# Patient Record
Sex: Female | Born: 1955 | Race: White | Hispanic: No | Marital: Married | Smoking: Former smoker
Health system: Southern US, Community
[De-identification: ages and names within clinical notes are randomized; demographics above are authoritative.]

## PROBLEM LIST (undated history)

## (undated) DIAGNOSIS — G43909 Migraine, unspecified, not intractable, without status migrainosus: Secondary | ICD-10-CM

## (undated) DIAGNOSIS — T783XXA Angioneurotic edema, initial encounter: Secondary | ICD-10-CM

## (undated) DIAGNOSIS — M1990 Unspecified osteoarthritis, unspecified site: Secondary | ICD-10-CM

## (undated) DIAGNOSIS — E785 Hyperlipidemia, unspecified: Secondary | ICD-10-CM

## (undated) DIAGNOSIS — Z8632 Personal history of gestational diabetes: Secondary | ICD-10-CM

## (undated) HISTORY — PX: IR ERCP: IMG2371

## (undated) HISTORY — PX: TOTAL HIP ARTHROPLASTY: SHX124

## (undated) HISTORY — DX: Angioneurotic edema, initial encounter: T78.3XXA

---

## 1998-02-14 ENCOUNTER — Emergency Department (HOSPITAL_COMMUNITY): Admission: EM | Admit: 1998-02-14 | Discharge: 1998-02-14 | Payer: Self-pay | Admitting: Emergency Medicine

## 1999-08-31 ENCOUNTER — Encounter: Payer: Self-pay | Admitting: Internal Medicine

## 1999-08-31 ENCOUNTER — Observation Stay (HOSPITAL_COMMUNITY): Admission: EM | Admit: 1999-08-31 | Discharge: 1999-09-02 | Payer: Self-pay | Admitting: *Deleted

## 1999-11-07 ENCOUNTER — Observation Stay (HOSPITAL_COMMUNITY): Admission: RE | Admit: 1999-11-07 | Discharge: 1999-11-07 | Payer: Self-pay | Admitting: *Deleted

## 1999-11-07 ENCOUNTER — Encounter: Payer: Self-pay | Admitting: *Deleted

## 1999-11-08 ENCOUNTER — Emergency Department (HOSPITAL_COMMUNITY): Admission: EM | Admit: 1999-11-08 | Discharge: 1999-11-08 | Payer: Self-pay | Admitting: Emergency Medicine

## 2003-11-03 ENCOUNTER — Encounter: Admission: RE | Admit: 2003-11-03 | Discharge: 2003-11-03 | Payer: Self-pay | Admitting: Emergency Medicine

## 2008-04-18 ENCOUNTER — Encounter: Admission: RE | Admit: 2008-04-18 | Discharge: 2008-04-18 | Payer: Self-pay | Admitting: Emergency Medicine

## 2010-01-30 ENCOUNTER — Inpatient Hospital Stay (HOSPITAL_COMMUNITY): Admission: RE | Admit: 2010-01-30 | Discharge: 2010-02-02 | Payer: Self-pay | Admitting: Orthopedic Surgery

## 2010-07-30 ENCOUNTER — Encounter: Payer: Self-pay | Admitting: Family Medicine

## 2010-09-22 LAB — DIFFERENTIAL
Lymphocytes Relative: 33 % (ref 12–46)
Lymphs Abs: 2.2 10*3/uL (ref 0.7–4.0)
Monocytes Relative: 7 % (ref 3–12)
Neutro Abs: 3.8 10*3/uL (ref 1.7–7.7)
Neutrophils Relative %: 58 % (ref 43–77)

## 2010-09-22 LAB — CBC
HCT: 24.7 % — ABNORMAL LOW (ref 36.0–46.0)
HCT: 30.2 % — ABNORMAL LOW (ref 36.0–46.0)
Hemoglobin: 10.5 g/dL — ABNORMAL LOW (ref 12.0–15.0)
Hemoglobin: 14.2 g/dL (ref 12.0–15.0)
MCH: 31.8 pg (ref 26.0–34.0)
MCHC: 33.9 g/dL (ref 30.0–36.0)
MCHC: 34.7 g/dL (ref 30.0–36.0)
MCV: 93.8 fL (ref 78.0–100.0)
RBC: 4.45 MIL/uL (ref 3.87–5.11)
RDW: 12.5 % (ref 11.5–15.5)

## 2010-09-22 LAB — BASIC METABOLIC PANEL
BUN: 10 mg/dL (ref 6–23)
CO2: 30 mEq/L (ref 19–32)
CO2: 30 mEq/L (ref 19–32)
Calcium: 8.5 mg/dL (ref 8.4–10.5)
Calcium: 9.5 mg/dL (ref 8.4–10.5)
Chloride: 105 mEq/L (ref 96–112)
Chloride: 105 mEq/L (ref 96–112)
GFR calc Af Amer: >60 mL/min (ref >60)
GFR calc non Af Amer: >60 mL/min (ref >60)
Glucose, Bld: 145 mg/dL — ABNORMAL HIGH (ref 70–99)
Glucose, Bld: 154 mg/dL — ABNORMAL HIGH (ref 70–99)
Potassium: 3.9 mEq/L (ref 3.5–5.1)
Sodium: 138 mEq/L (ref 135–145)
Sodium: 139 mEq/L (ref 135–145)

## 2010-09-22 LAB — PROTIME-INR: INR: 0.96 (ref 0.00–1.49)

## 2010-09-22 LAB — URINALYSIS, ROUTINE W REFLEX MICROSCOPIC
Glucose, UA: NEGATIVE mg/dL
Ketones, ur: NEGATIVE mg/dL
Nitrite: NEGATIVE
Protein, ur: NEGATIVE mg/dL

## 2010-09-22 LAB — ABO/RH: ABO/RH(D): A POS

## 2010-09-22 LAB — APTT: aPTT: 29 seconds (ref 24–37)

## 2010-09-22 LAB — SURGICAL PCR SCREEN: MRSA, PCR: NEGATIVE

## 2010-11-07 ENCOUNTER — Other Ambulatory Visit: Payer: Self-pay | Admitting: Family Medicine

## 2010-11-07 ENCOUNTER — Other Ambulatory Visit (HOSPITAL_COMMUNITY)
Admission: RE | Admit: 2010-11-07 | Discharge: 2010-11-07 | Disposition: A | Discharge status: Home / Self Care | Payer: BC Managed Care – PPO | Source: Ambulatory Visit | Attending: Family Medicine | Admitting: Family Medicine

## 2010-11-07 DIAGNOSIS — Z124 Encounter for screening for malignant neoplasm of cervix: Secondary | ICD-10-CM | POA: Insufficient documentation

## 2010-11-07 DIAGNOSIS — Z1159 Encounter for screening for other viral diseases: Secondary | ICD-10-CM | POA: Insufficient documentation

## 2010-11-23 NOTE — Procedures (Signed)
[place]  Patient:    Angel Edwards, Angel Edwards                       MRN: [id number] Proc. Date: 11/07/99 Adm. Date:  [id number] Disc. Date: [id number] Attending:  Louie Bun CC:         Vikki Ports, M.D.                           Procedure Report  PROCEDURE:  Endoscopic retrograde cholangiopancreatogram.  SURGEON:  Roosvelt Harps, M.D.  INDICATIONS:  [age]-year-old female with recent waxing and waning pancreatitis of unclear etiology.  Ultrasonogram did not reveal any gallstones.  She has a family history of pancreatitis and does not drink alcohol.  PREPARATION:  She is n.p.o. since midnight.  PREPROCEDURE SEDATION PRIOR TO AND DURING THE PROCEDURE:  She received a total f 200 mcg of Fentanyl and 20 mg of Versed intravenously; in addition, she was on L of nasal cannula O2 and received 1 mg of Glucagon to facilitate small bowel relaxation.  DESCRIPTION OF PROCEDURE:  The Olympus video duodenoscope was inserted via the mouth and advanced through the upper esophageal sphincter with ease. Intubation was then carried out into the descending duodenum.  The scope was withdrawn to he point of visualizing the ampulla.  The Tritome catheter preloaded with a guidewire was used to cannulate the ampulla easily.  Initially, the pancreatic duct was cannulated and a single injection was made to see if there were anatomical abnormalities of this duct, which appeared to be normal.  Then attention was given to cannulating the common bile duct.  This was accomplished with some difficulty because of what appeared to be acutely angulated distal common bile duct, which  initially gave the impression of possible distal stone.  The guidewire could not be advanced due to this angulation.  The Tritome catheter was withdrawn and a tapered catheter was used with a smaller guidewire but similarly could not cannulate the proximal bile duct.  The  Tritome was again inserted and another injection of the common bile duct was made.  Good films were obtained and reviewed with Dr. Lorin Picket of radiology who felt that there were no filling defects but this acute angulation was an anatomic variant that should not have any pathologic implications.  There may have been a small submucosal injection of contrast at the ampulla.  Patient tolerated the procedure well.  Pulse, blood pressure and oximetry testing were stable throughout.  She was observed in recovery for over two hours before discharge.  She will be seen in the office in followup in two to three weeks.  The x-ray films were reviewed with the radiologist.  The findings were as follows:  #1 - Possibly minimal beading of the pancreatic duct; #2 - angulated distal common bile duct ut no filling defects; #3 - possible small submucosal ampullary injection which was resorbed by the final film.  IMPRESSION:  Thus, the patient does not have pancreas disease or any anatomic defect predisposing her to pancreatitis; in addition, if she had a common bile uct stone, it seems to have passed.  She will be observed for further recurrences before any further evaluation. DD:  11/07/99 TD:  11/09/99 Job: [id number]

## 2011-01-09 IMAGING — CR DG CHEST 2V
2 series · 2 of 2 positions shown · non-contrast
Comparison: None.

CLINICAL DATA: Preop for left hip surgery

CHEST - 2 VIEW

[w chest pa]
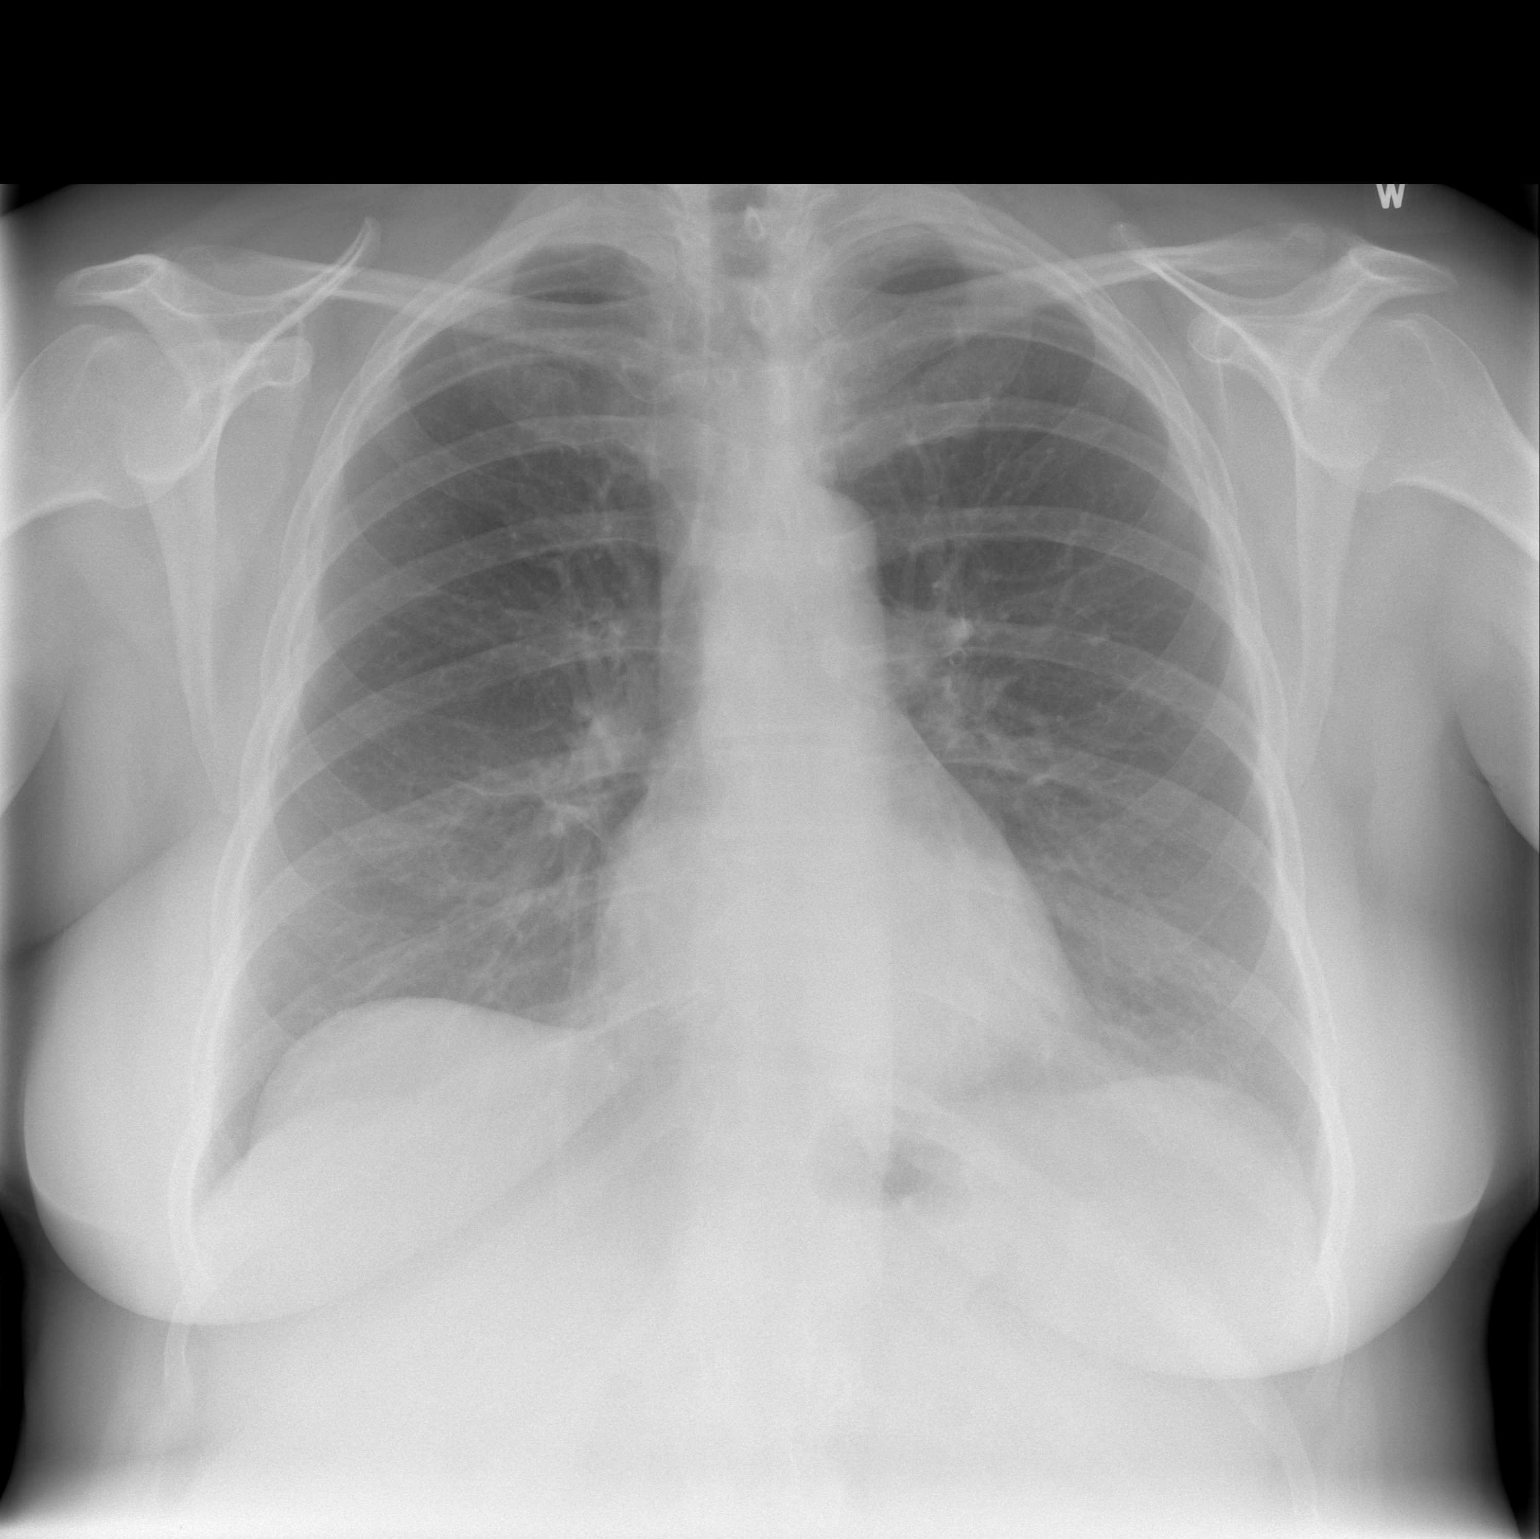

[w chest lat]
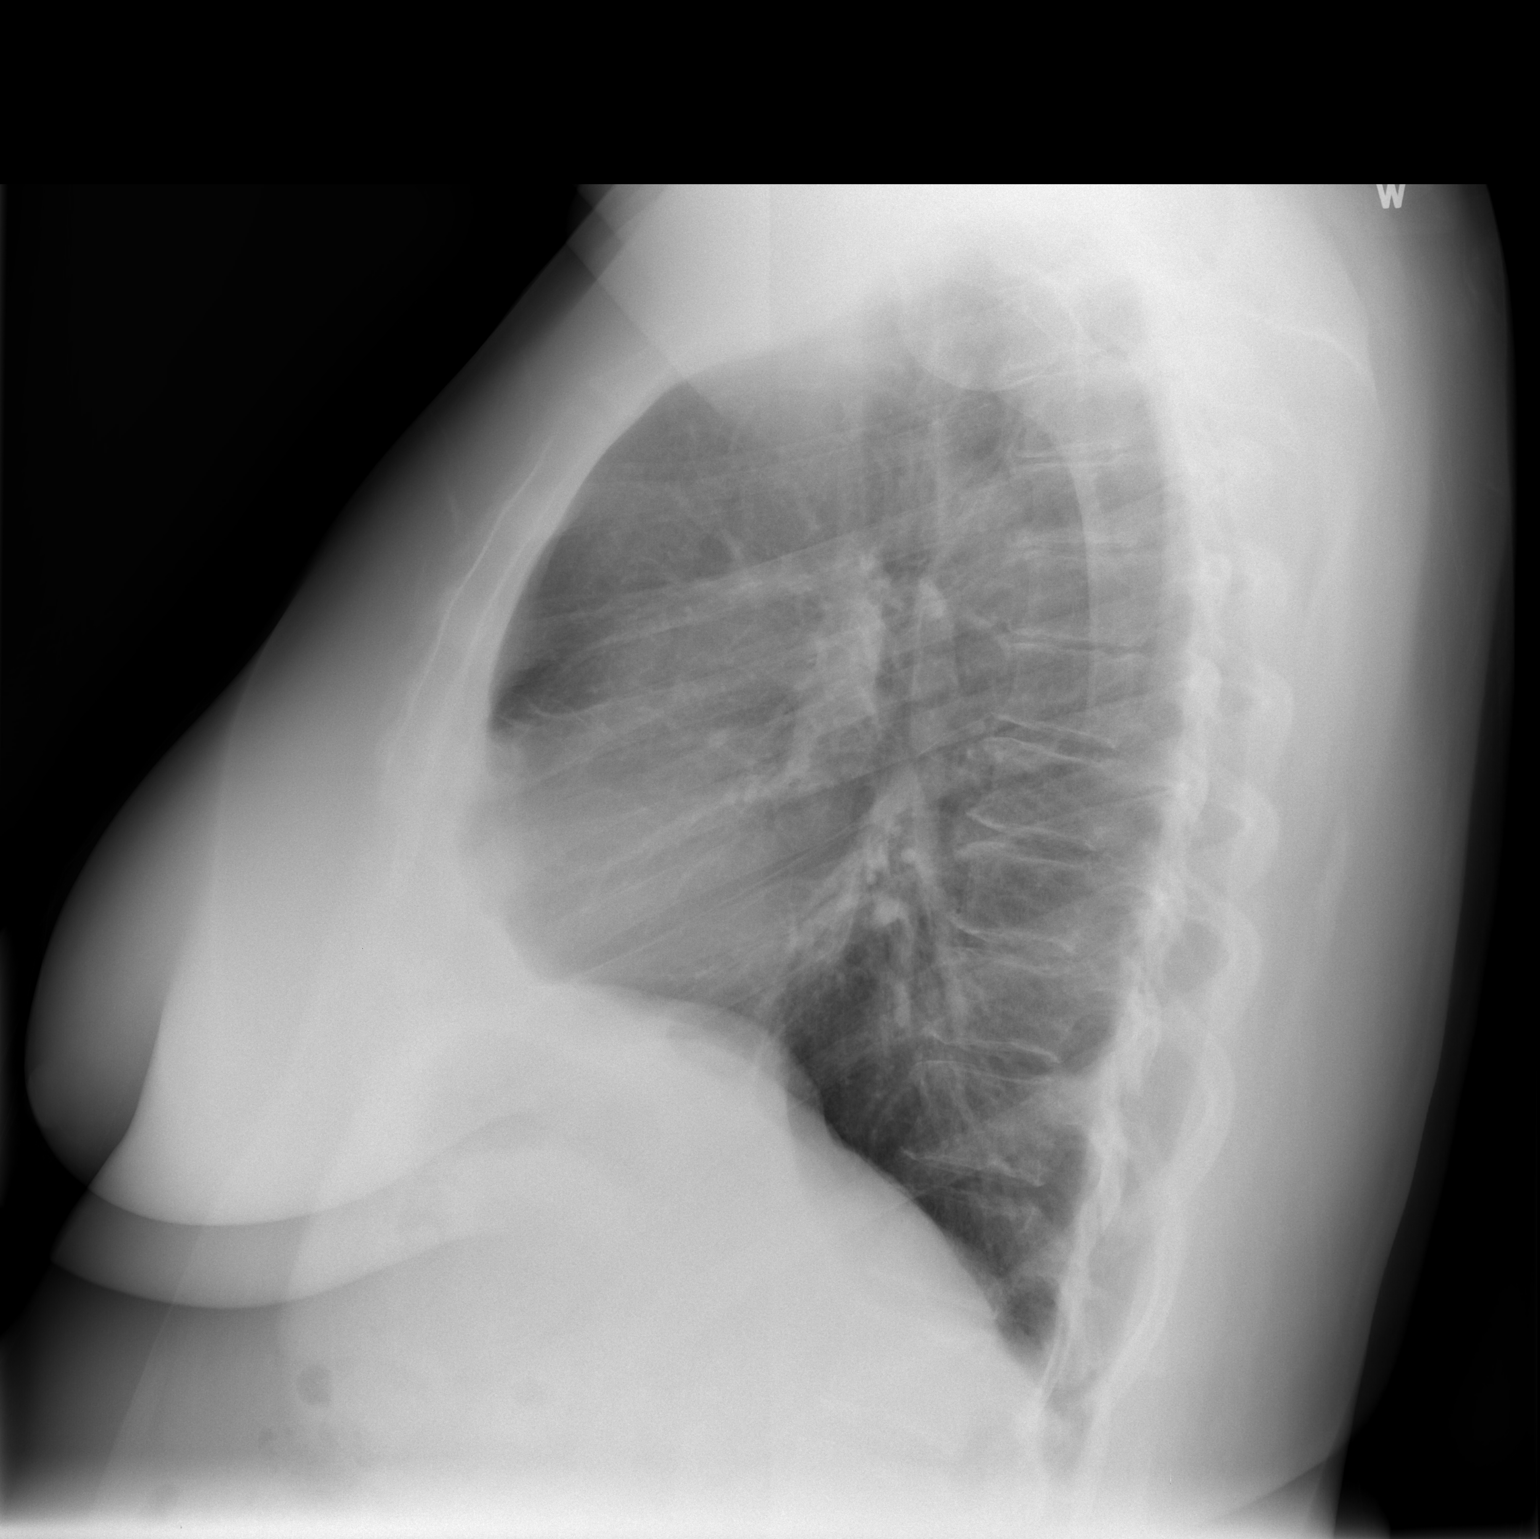

[2 of 2 positions shown; findings below may reference images not displayed]

FINDINGS: Heart and mediastinal contours normal.  Lungs clear.  No
pleural fluid.  Osseous structures and soft tissues unremarkable.
IMPRESSION: No acute or significant findings.

## 2011-10-31 ENCOUNTER — Other Ambulatory Visit: Payer: Self-pay | Admitting: Family Medicine

## 2011-10-31 DIAGNOSIS — Z1231 Encounter for screening mammogram for malignant neoplasm of breast: Secondary | ICD-10-CM

## 2011-11-27 ENCOUNTER — Ambulatory Visit
Admission: RE | Admit: 2011-11-27 | Discharge: 2011-11-27 | Disposition: A | Discharge status: Home / Self Care | Payer: BC Managed Care – PPO | Source: Ambulatory Visit | Attending: Family Medicine | Admitting: Family Medicine

## 2011-11-27 DIAGNOSIS — Z1231 Encounter for screening mammogram for malignant neoplasm of breast: Secondary | ICD-10-CM

## 2013-08-09 ENCOUNTER — Other Ambulatory Visit: Payer: Self-pay | Admitting: Gastroenterology

## 2014-03-24 ENCOUNTER — Other Ambulatory Visit: Payer: Self-pay | Admitting: Gastroenterology

## 2014-03-24 DIAGNOSIS — R1084 Generalized abdominal pain: Secondary | ICD-10-CM

## 2014-03-29 ENCOUNTER — Ambulatory Visit
Admission: RE | Admit: 2014-03-29 | Discharge: 2014-03-29 | Disposition: A | Discharge status: Home / Self Care | Payer: BC Managed Care – PPO | Source: Ambulatory Visit | Attending: Gastroenterology | Admitting: Gastroenterology

## 2014-03-29 DIAGNOSIS — R1084 Generalized abdominal pain: Secondary | ICD-10-CM

## 2015-03-16 IMAGING — US US ABDOMEN COMPLETE
1 series · 14 of 25 positions shown · non-contrast
Comparison: None.

CLINICAL DATA: Abdominal pain.  History of pancreatitis.

EXAM:
ULTRASOUND ABDOMEN COMPLETE

[Series 1: us abdomen complete · 0.28mm/px · 14 of 77 slices shown]
[im 1/77]
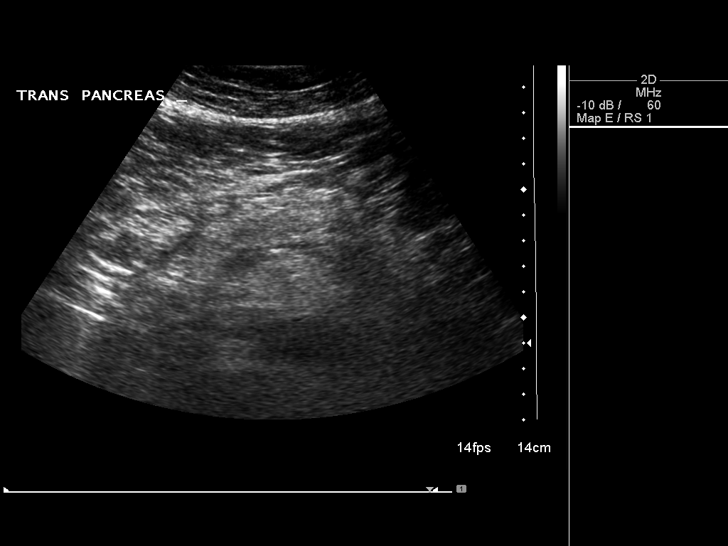
[im 7/77]
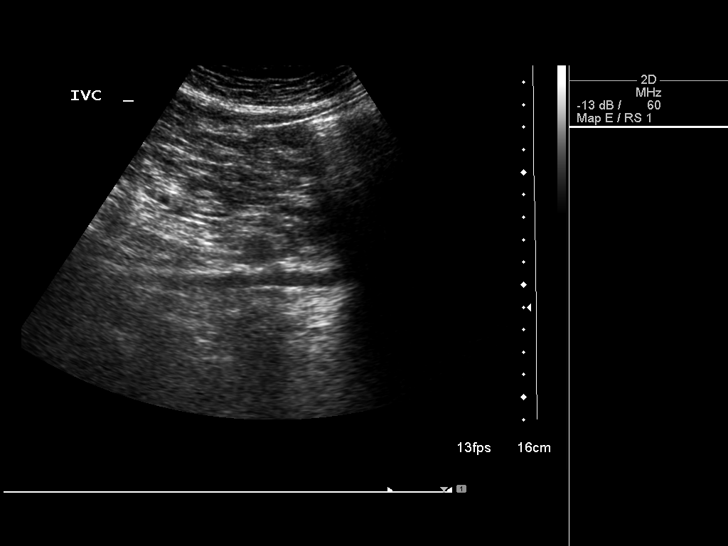
[im 13/77]
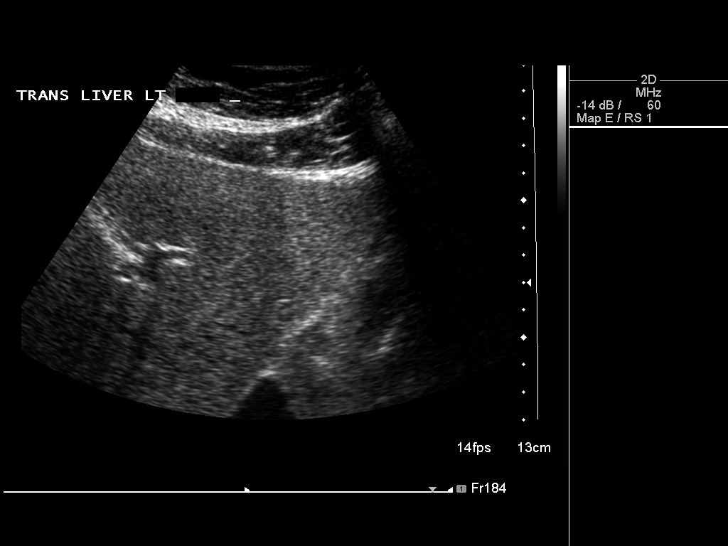
[im 20/77]
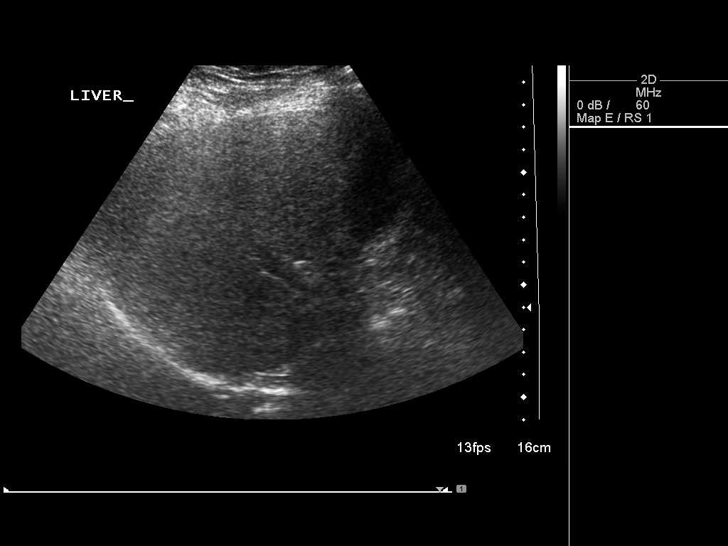
[im 26/77]
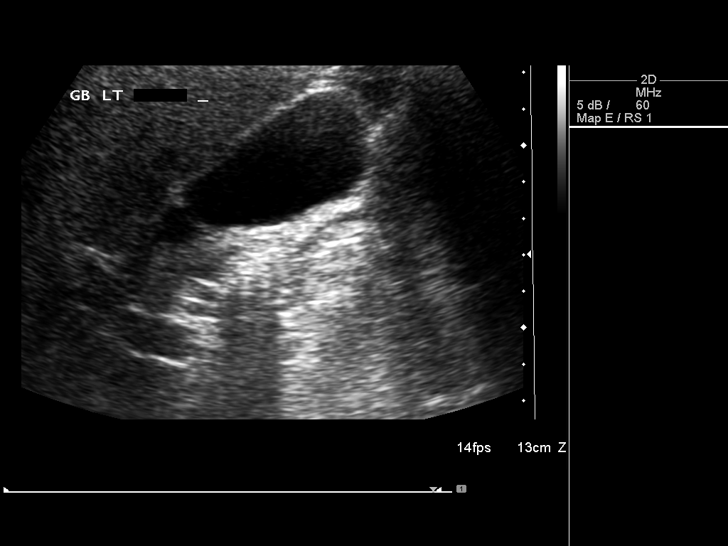
[im 29/77]
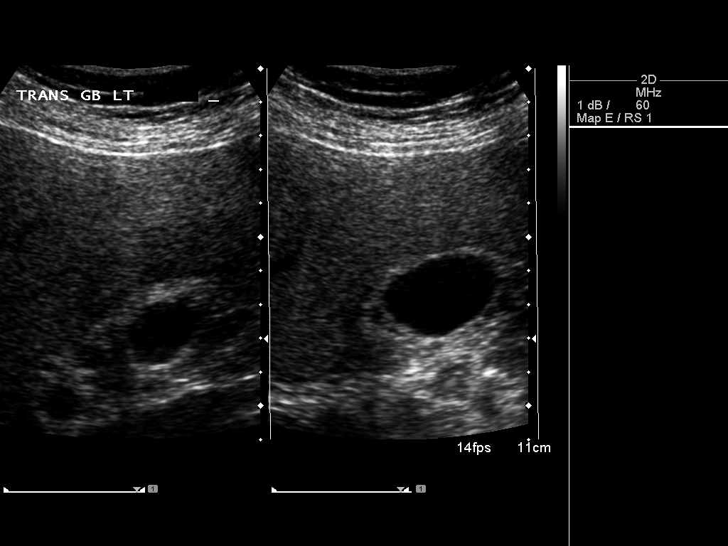
[im 35/77]
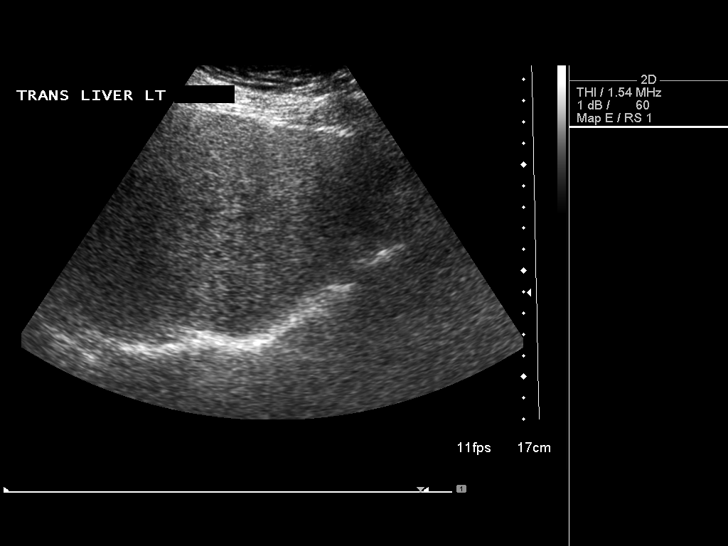
[im 42/77]
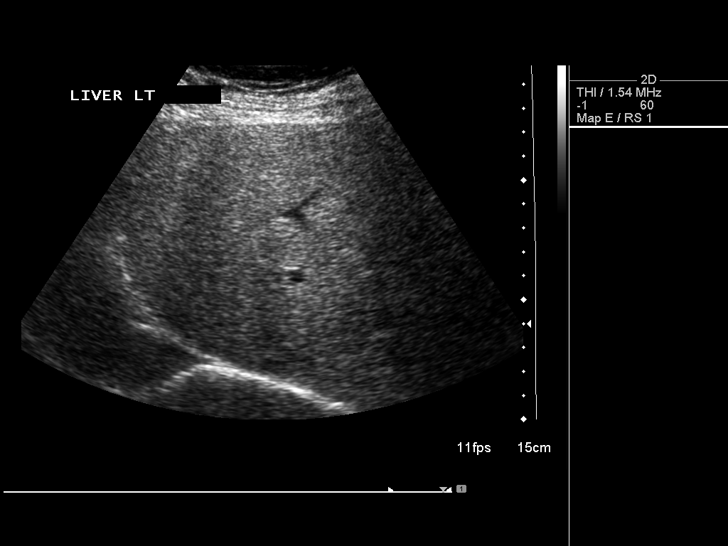
[im 48/77]
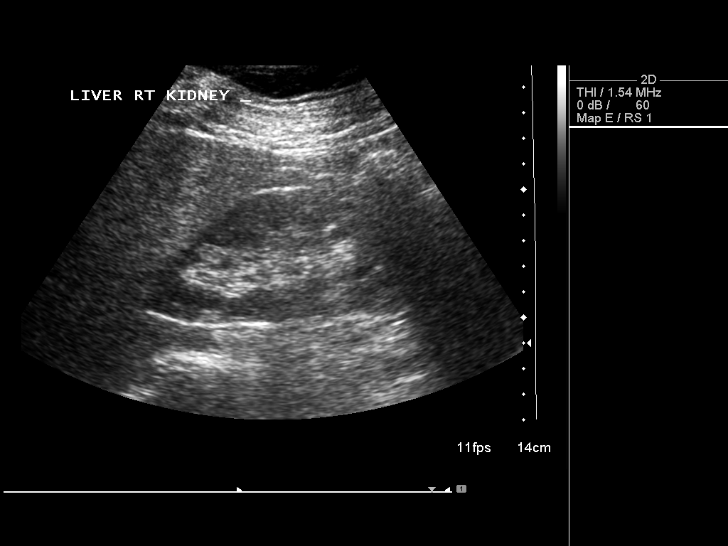
[im 51/77]
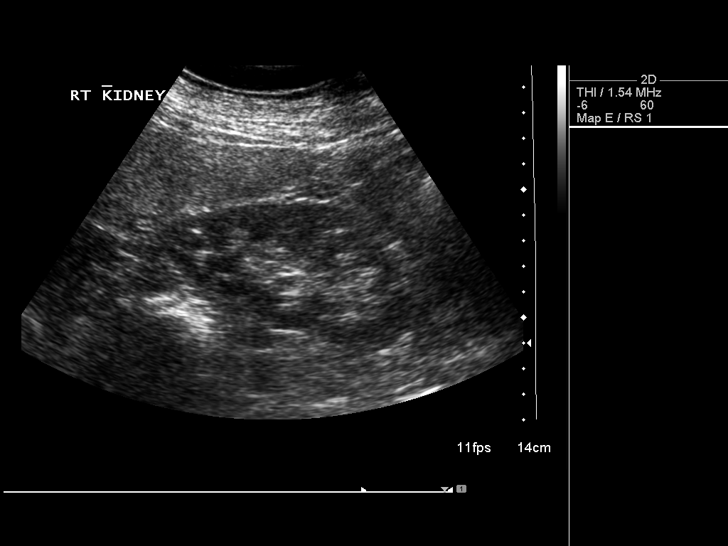
[im 58/77]
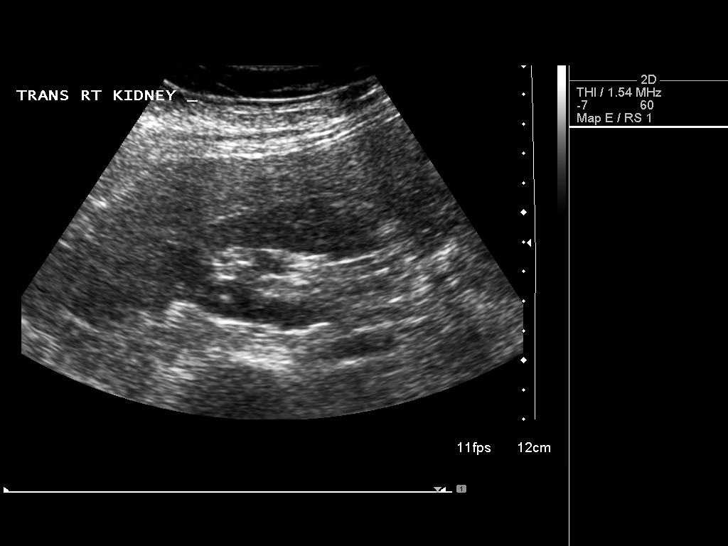
[im 64/77]
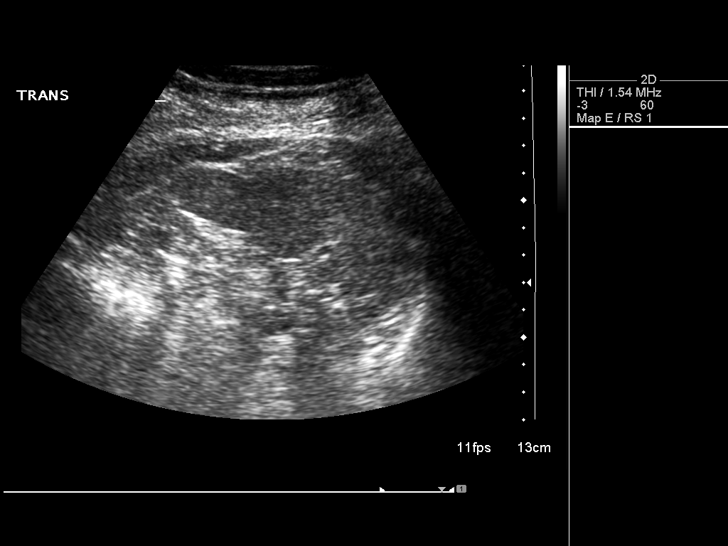
[im 70/77]
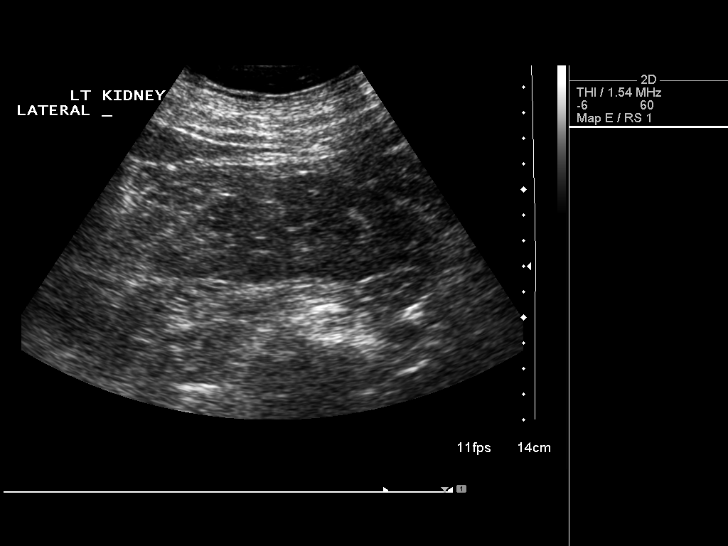
[im 77/77]
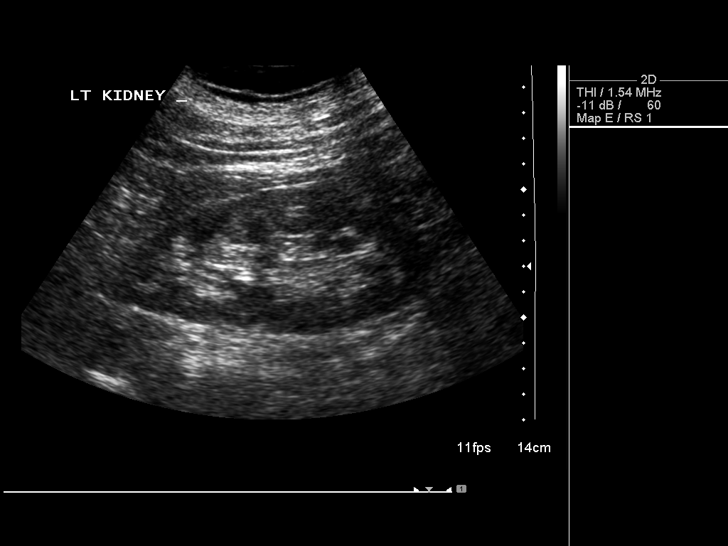

[14 of 25 positions shown; findings below may reference images not displayed]

FINDINGS: Gallbladder:

No gallstones or wall thickening visualized. No sonographic Murphy
sign noted.

Common bile duct:

Diameter: 3.1 mm, normal.

Liver:

No focal lesion identified. Within normal limits in parenchymal
echogenicity.

IVC:

No abnormality visualized.

Pancreas:

Visualized portion unremarkable.

Spleen:

7.1 cm, normal.

Right Kidney:

Length: 11.1 cm. Echogenicity within normal limits. No mass or
hydronephrosis visualized.

Left Kidney:

Length: 0.5 cm. Echogenicity within normal limits. No mass or
hydronephrosis visualized.

Abdominal aorta:

2.3 cm, normal.

Other findings:

None.
IMPRESSION: Normal exam.

## 2017-08-29 DIAGNOSIS — M1611 Unilateral primary osteoarthritis, right hip: Secondary | ICD-10-CM | POA: Diagnosis not present

## 2017-08-29 DIAGNOSIS — M25551 Pain in right hip: Secondary | ICD-10-CM | POA: Diagnosis not present

## 2017-10-08 DIAGNOSIS — M1611 Unilateral primary osteoarthritis, right hip: Secondary | ICD-10-CM | POA: Diagnosis not present

## 2017-11-12 DIAGNOSIS — E782 Mixed hyperlipidemia: Secondary | ICD-10-CM | POA: Diagnosis not present

## 2017-11-12 DIAGNOSIS — E559 Vitamin D deficiency, unspecified: Secondary | ICD-10-CM | POA: Diagnosis not present

## 2017-11-12 DIAGNOSIS — E538 Deficiency of other specified B group vitamins: Secondary | ICD-10-CM | POA: Diagnosis not present

## 2017-11-12 DIAGNOSIS — R7303 Prediabetes: Secondary | ICD-10-CM | POA: Diagnosis not present

## 2017-11-12 DIAGNOSIS — Z01810 Encounter for preprocedural cardiovascular examination: Secondary | ICD-10-CM | POA: Diagnosis not present

## 2017-11-20 DIAGNOSIS — Z01810 Encounter for preprocedural cardiovascular examination: Secondary | ICD-10-CM | POA: Diagnosis not present

## 2017-11-20 DIAGNOSIS — E785 Hyperlipidemia, unspecified: Secondary | ICD-10-CM | POA: Diagnosis not present

## 2018-01-02 NOTE — Patient Instructions (Addendum)
Angel Edwards  01/02/2018   Your procedure is scheduled on:  01-14-2018  Report to [facility] Main  Entrance  Report to admitting at  6:00 AM    Call this number if you have problems the morning of surgery [phone]   Remember: Do not eat food or drink liquids :After Midnight.     Take these medicines the morning of surgery with A SIP OF WATER:   NONE                                You may not have any metal on your body including hair pins and              piercings  Do not wear jewelry, make-up, lotions, powders or perfumes, deodorant             Do not wear nail polish.  Do not shave  48 hours prior to surgery.    Do not bring valuables to the hospital. Center IS NOT             RESPONSIBLE   FOR VALUABLES.  Contacts, dentures or bridgework may not be worn into surgery.  Leave suitcase in the car. After surgery it may be brought to your room.     Special Instructions:  COUGH/ DEEP BREATHING AND LEG EXERCISES              Please read over the following fact sheets you were given: _____________________________________________________________________             [facility] - Preparing for Surgery Before surgery, you can play an important role.  Because skin is not sterile, your skin needs to be as free of germs as possible.  You can reduce the number of germs on your skin by washing with CHG (chlorahexidine gluconate) soap before surgery.  CHG is an antiseptic cleaner which kills germs and bonds with the skin to continue killing germs even after washing. Please DO NOT use if you have an allergy to CHG or antibacterial soaps.  If your skin becomes reddened/irritated stop using the CHG and inform your nurse when you arrive at Short Stay. Do not shave (including legs and underarms) for at least 48 hours prior to the first CHG shower.  You may shave your face/neck. Please follow these instructions carefully:  1.  Shower with CHG Soap the night  before surgery and the  morning of Surgery.  2.  If you choose to wash your hair, wash your hair first as usual with your  normal  shampoo.  3.  After you shampoo, rinse your hair and body thoroughly to remove the  shampoo.                                        4.  Use CHG as you would any other liquid soap.  You can apply chg directly  to the skin and wash                       Gently with a scrungie or clean washcloth.  5.  Apply the CHG Soap to your body ONLY FROM THE NECK DOWN.   Do not use on face/ open  Wound or open sores. Avoid contact with eyes, ears mouth and genitals (private parts).                       Wash face,  Genitals (private parts) with your normal soap.             6.  Wash thoroughly, paying special attention to the area where your surgery  will be performed.  7.  Thoroughly rinse your body with warm water from the neck down.  8.  DO NOT shower/wash with your normal soap after using and rinsing off  the CHG Soap.             9.  Pat yourself dry with a clean towel.            10.  Wear clean pajamas.            11.  Place clean sheets on your bed the night of your first shower and do not  sleep with pets. Day of Surgery : Do not apply any lotions/deodorants the morning of surgery.  Please wear clean clothes to the hospital/surgery center.  FAILURE TO FOLLOW THESE INSTRUCTIONS MAY RESULT IN THE CANCELLATION OF YOUR SURGERY PATIENT SIGNATURE_________________________________  NURSE SIGNATURE__________________________________  ________________________________________________________________________   Angel Edwards  An incentive spirometer is a tool that can help keep your lungs clear and active. This tool measures how well you are filling your lungs with each breath. Taking long deep breaths may help reverse or decrease the chance of developing breathing (pulmonary) problems (especially infection) following:  A long period of time when  you are unable to move or be active. BEFORE THE PROCEDURE   If the spirometer includes an indicator to show your best effort, your nurse or respiratory therapist will set it to a desired goal.  If possible, sit up straight or lean slightly forward. Try not to slouch.  Hold the incentive spirometer in an upright position. INSTRUCTIONS FOR USE  1. Sit on the edge of your bed if possible, or sit up as far as you can in bed or on a chair. 2. Hold the incentive spirometer in an upright position. 3. Breathe out normally. 4. Place the mouthpiece in your mouth and seal your lips tightly around it. 5. Breathe in slowly and as deeply as possible, raising the piston or the ball toward the top of the column. 6. Hold your breath for 3-5 seconds or for as long as possible. Allow the piston or ball to fall to the bottom of the column. 7. Remove the mouthpiece from your mouth and breathe out normally. 8. Rest for a few seconds and repeat Steps 1 through 7 at least 10 times every 1-2 hours when you are awake. Take your time and take a few normal breaths between deep breaths. 9. The spirometer may include an indicator to show your best effort. Use the indicator as a goal to work toward during each repetition. 10. After each set of 10 deep breaths, practice coughing to be sure your lungs are clear. If you have an incision (the cut made at the time of surgery), support your incision when coughing by placing a pillow or rolled up towels firmly against it. Once you are able to get out of bed, walk around indoors and cough well. You may stop using the incentive spirometer when instructed by your caregiver.  RISKS AND COMPLICATIONS  Take your time so you do not get dizzy or light-headed.  If you are in pain, you may need to take or ask for pain medication before doing incentive spirometry. It is harder to take a deep breath if you are having pain. AFTER USE  Rest and breathe slowly and easily.  It can be  helpful to keep track of a log of your progress. Your caregiver can provide you with a simple table to help with this. If you are using the spirometer at home, follow these instructions: SEEK MEDICAL CARE IF:   You are having difficultly using the spirometer.  You have trouble using the spirometer as often as instructed.  Your pain medication is not giving enough relief while using the spirometer.  You develop fever of 100.5 F (38.1 C) or higher. SEEK IMMEDIATE MEDICAL CARE IF:   You cough up bloody sputum that had not been present before.  You develop fever of 102 F (38.9 C) or greater.  You develop worsening pain at or near the incision site. MAKE SURE YOU:   Understand these instructions.  Will watch your condition.  Will get help right away if you are not doing well or get worse. Document Released: 11/04/2006 Document Revised: 09/16/2011 Document Reviewed: 01/05/2007 Exit[employer] Patient Information 2014 [employer], [place].   ________________________________________________________________________  WHAT IS A BLOOD TRANSFUSION? Blood Transfusion Information  A transfusion is the replacement of blood or some of its parts. Blood is made up of multiple cells which provide different functions.  Red blood cells carry oxygen and are used for blood loss replacement.  White blood cells fight against infection.  Platelets control bleeding.  Plasma helps clot blood.  Other blood products are available for specialized needs, such as hemophilia or other clotting disorders. BEFORE THE TRANSFUSION  Who gives blood for transfusions?   Healthy volunteers who are fully evaluated to make sure their blood is safe. This is blood bank blood. Transfusion therapy is the safest it has ever been in the practice of medicine. Before blood is taken from a donor, a complete history is taken to make sure that person has no history of diseases nor engages in risky social behavior (examples are  intravenous drug use or sexual activity with multiple partners). The donor's travel history is screened to minimize risk of transmitting infections, such as malaria. The donated blood is tested for signs of infectious diseases, such as HIV and hepatitis. The blood is then tested to be sure it is compatible with you in order to minimize the chance of a transfusion reaction. If you or a relative donates blood, this is often done in anticipation of surgery and is not appropriate for emergency situations. It takes many days to process the donated blood. RISKS AND COMPLICATIONS Although transfusion therapy is very safe and saves many lives, the main dangers of transfusion include:   Getting an infectious disease.  Developing a transfusion reaction. This is an allergic reaction to something in the blood you were given. Every precaution is taken to prevent this. The decision to have a blood transfusion has been considered carefully by your caregiver before blood is given. Blood is not given unless the benefits outweigh the risks. AFTER THE TRANSFUSION  Right after receiving a blood transfusion, you will usually feel much better and more energetic. This is especially true if your red blood cells have gotten low (anemic). The transfusion raises the level of the red blood cells which carry oxygen, and this usually causes an energy increase.  The nurse administering the transfusion will monitor you carefully for  complications. HOME CARE INSTRUCTIONS  No special instructions are needed after a transfusion. You may find your energy is better. Speak with your caregiver about any limitations on activity for underlying diseases you may have. SEEK MEDICAL CARE IF:   Your condition is not improving after your transfusion.  You develop redness or irritation at the intravenous (IV) site. SEEK IMMEDIATE MEDICAL CARE IF:  Any of the following symptoms occur over the next 12 hours:  Shaking chills.  You have a  temperature by mouth above 102 F (38.9 C), not controlled by medicine.  Chest, back, or muscle pain.  People around you feel you are not acting correctly or are confused.  Shortness of breath or difficulty breathing.  Dizziness and fainting.  You get a rash or develop hives.  You have a decrease in urine output.  Your urine turns a dark color or changes to pink, red, or brown. Any of the following symptoms occur over the next 10 days:  You have a temperature by mouth above 102 F (38.9 C), not controlled by medicine.  Shortness of breath.  Weakness after normal activity.  The white part of the eye turns yellow (jaundice).  You have a decrease in the amount of urine or are urinating less often.  Your urine turns a dark color or changes to pink, red, or brown. Document Released: 06/21/2000 Document Revised: 09/16/2011 Document Reviewed: 02/08/2008 [facility] Patient Information 2014 [place], [place].  _______________________________________________________________________

## 2018-01-05 ENCOUNTER — Encounter (HOSPITAL_COMMUNITY)
Admission: RE | Admit: 2018-01-05 | Discharge: 2018-01-05 | Disposition: A | Discharge status: Home / Self Care | Payer: 59 | Source: Ambulatory Visit | Attending: Orthopedic Surgery | Admitting: Orthopedic Surgery

## 2018-01-05 ENCOUNTER — Encounter (HOSPITAL_COMMUNITY): Payer: Self-pay

## 2018-01-05 DIAGNOSIS — M1611 Unilateral primary osteoarthritis, right hip: Secondary | ICD-10-CM | POA: Insufficient documentation

## 2018-01-05 DIAGNOSIS — Z01812 Encounter for preprocedural laboratory examination: Secondary | ICD-10-CM | POA: Diagnosis not present

## 2018-01-05 HISTORY — DX: Migraine, unspecified, not intractable, without status migrainosus: G43.909

## 2018-01-05 HISTORY — DX: Personal history of gestational diabetes: Z86.32

## 2018-01-05 HISTORY — DX: Unspecified osteoarthritis, unspecified site: M19.90

## 2018-01-05 HISTORY — DX: Hyperlipidemia, unspecified: E78.5

## 2018-01-05 LAB — COMPREHENSIVE METABOLIC PANEL
ALBUMIN: 4.1 g/dL (ref 3.5–5.0)
ALK PHOS: 53 U/L (ref 38–126)
ALT: 17 U/L (ref 0–44)
ANION GAP: 8 (ref 5–15)
AST: 16 U/L (ref 15–41)
BILIRUBIN TOTAL: 1 mg/dL (ref 0.3–1.2)
BUN: 14 mg/dL (ref 8–23)
CALCIUM: 9.3 mg/dL (ref 8.9–10.3)
CO2: 27 mmol/L (ref 22–32)
Chloride: 106 mmol/L (ref 98–111)
Creatinine, Ser: 0.64 mg/dL (ref 0.44–1.00)
GFR calc Af Amer: >60 mL/min (ref >60)
GLUCOSE: 115 mg/dL — AB (ref 70–99)
Potassium: 4.2 mmol/L (ref 3.5–5.1)
Sodium: 141 mmol/L (ref 135–145)
TOTAL PROTEIN: 7.5 g/dL (ref 6.5–8.1)

## 2018-01-05 LAB — URINALYSIS, ROUTINE W REFLEX MICROSCOPIC
Bilirubin Urine: NEGATIVE
Glucose, UA: NEGATIVE mg/dL
Hgb urine dipstick: NEGATIVE
Ketones, ur: NEGATIVE mg/dL
Leukocytes, UA: NEGATIVE
NITRITE: NEGATIVE
PH: 7 (ref 5.0–8.0)
Protein, ur: NEGATIVE mg/dL
SPECIFIC GRAVITY, URINE: 1.005 (ref 1.005–1.030)

## 2018-01-05 LAB — CBC
HEMATOCRIT: 43.2 % (ref 36.0–46.0)
HEMOGLOBIN: 14.1 g/dL (ref 12.0–15.0)
MCH: 32.1 pg (ref 26.0–34.0)
MCHC: 32.6 g/dL (ref 30.0–36.0)
MCV: 98.4 fL (ref 78.0–100.0)
Platelets: 359 10*3/uL (ref 150–400)
RBC: 4.39 MIL/uL (ref 3.87–5.11)
RDW: 12.4 % (ref 11.5–15.5)
WBC: 6.4 10*3/uL (ref 4.0–10.5)

## 2018-01-05 LAB — SURGICAL PCR SCREEN
MRSA, PCR: NEGATIVE
Staphylococcus aureus: NEGATIVE

## 2018-01-05 LAB — APTT: APTT: 24 s (ref 24–36)

## 2018-01-05 LAB — PROTIME-INR
INR: 0.96
PROTHROMBIN TIME: 12.6 s (ref 11.4–15.2)

## 2018-01-05 NOTE — Progress Notes (Signed)
Surgical clearance from dr Juluis Rainierelizabeth barnes w/ chart.  Cardiologist note and ekg, dated 11-20-2017, w/ chart.

## 2018-01-06 NOTE — H&P (Signed)
TOTAL HIP ADMISSION H&P  Patient is admitted for right total hip arthroplasty.  Subjective:  Chief Complaint: right hip pain  HPI: Angel Edwards, 62 y.o. female, has a history of pain and functional disability in the right hip(s) due to arthritis and patient has failed non-surgical conservative treatments for greater than 12 weeks to include NSAID's and/or analgesics, corticosteriod injections and activity modification.  Onset of symptoms was gradual starting 2 years ago with gradually worsening course since that time.The patient noted no past surgery on the right hip(s).  Patient currently rates pain in the right hip at 5 out of 10 with activity. Patient has night pain, worsening of pain with activity and weight bearing and pain that interfers with activities of daily living. Patient has evidence of subchondral cysts and joint space narrowing by imaging studies. This condition presents safety issues increasing the risk of falls. There is no current active infection.  There are no active problems to display for this patient.  Past Medical History:  Diagnosis Date  . History of gestational diabetes   . Hyperlipidemia   . Migraines   . OA (osteoarthritis)    right hip      No current facility-administered medications for this encounter.    Current Outpatient Medications  Medication Sig Dispense Refill Last Dose  . acetaminophen (TYLENOL) 650 MG CR tablet Take 1,300 mg by mouth daily as needed for pain.     Marland Kitchen. ibuprofen (ADVIL,MOTRIN) 200 MG tablet Take 400 mg by mouth daily as needed for moderate pain.     . Menthol, Topical Analgesic, (BIOFREEZE EX) Apply 1 application topically daily as needed (hip pain).     . naproxen sodium (ALEVE) 220 MG tablet Take 440 mg by mouth daily as needed (pain).      Allergies  Allergen Reactions  . Demerol [Meperidine Hcl] Nausea And Vomiting  . Lidocaine Other (See Comments)    Body absorbs medication too fast, that it wears off quickly    Social  History   Tobacco Use  . Smoking status: Former Smoker    Packs/day: 1.00    Years: 15.00    Pack years: 15.00    Types: Cigarettes    Last attempt to quit: 01/06/2007    Years since quitting: 11.0  . Smokeless tobacco: Never Used  Substance Use Topics  . Alcohol use: Never    Frequency: Never    No family history on file.   Review of Systems  Constitutional: Negative for chills and fever.  HENT: Negative for congestion, sore throat and tinnitus.   Eyes: Negative for double vision, photophobia and pain.  Respiratory: Negative for cough, shortness of breath and wheezing.   Cardiovascular: Negative for chest pain, palpitations and orthopnea.  Gastrointestinal: Negative for heartburn, nausea and vomiting.  Genitourinary: Negative for dysuria, frequency and urgency.  Musculoskeletal: Positive for joint pain.  Neurological: Negative for dizziness, weakness and headaches.  Psychiatric/Behavioral: Negative for depression.    Objective:  Physical Exam  Well nourished and well developed. General: Alert and oriented x3, cooperative and pleasant, no acute distress. Head: normocephalic, atraumatic, neck supple. Eyes: EOMI. Respiratory: breath sounds clear in all fields, no wheezing, rales, or rhonchi. Cardiovascular: Regular rate and rhythm, no murmurs, gallops or rubs.  Abdomen: non-tender to palpation and soft, normoactive bowel sounds. Musculoskeletal: LEFT hip and knee flexed to 100, rotated in 30 come out 40 to abducted 40 without discomfort. There is no trochanteric tenderness.  RIGHT hip to be flexed to 110  with minimal internal rotation about 20 external rotation and about the size 30 of abduction. She has an antalgic gait pattern on the RIGHT. There is no trochanteric tenderness.  Calves soft and nontender. Motor function intact in LE. Strength 5/5 LE bilaterally. Neuro: Distal pulses 2+. Sensation to light touch intact in LE.  Vital signs in last 24 hours:     Labs:  Estimated body mass index is 31.76 kg/m as calculated from the following:   Height as of 01/05/18: 5\' 4"  (1.626 m).   Weight as of 01/05/18: 83.9 kg (185 lb).   Imaging Review Plain radiographs demonstrate severe degenerative joint disease of the right hip(s). The bone quality appears to be adequate for age and reported activity level.  Preoperative templating of the joint replacement has been completed, documented, and submitted to the Operating Room personnel in order to optimize intra-operative equipment management.  Assessment/Plan:  End stage arthritis, right hip(s)  The patient history, physical examination, clinical judgement of the provider and imaging studies are consistent with end stage degenerative joint disease of the right hip(s) and total hip arthroplasty is deemed medically necessary. The treatment options including medical management, injection therapy, arthroscopy and arthroplasty were discussed at length. The risks and benefits of total hip arthroplasty were presented and reviewed. The risks due to aseptic loosening, infection, stiffness, dislocation/subluxation,  thromboembolic complications and other imponderables were discussed.  The patient acknowledged the explanation, agreed to proceed with the plan and consent was signed. Patient is being admitted for inpatient treatment for surgery, pain control, PT, OT, prophylactic antibiotics, VTE prophylaxis, progressive ambulation and ADL's and discharge planning.The patient is planning to be discharged to home with either home exercise program or HHPT.  Therapy Plans: HEP vs HHPT Disposition: Home with  Planned DVT Prophylaxis: aspirin 325 mg BID DME needed: 3-n-1 (already has walker) PCP: Dr. Juluis Rainier [facility]) - medical clearance on record TXA: IV Allergies: None  Other: Pt states she has a history of marcaine/lidocaine not being effective History of nausea/vomiting with general anesthesia   - Patient was  instructed on what medications to stop prior to surgery. - Follow-up visit in 2 weeks with Dr. Lequita Halt - Begin physical therapy following surgery - Pre-operative lab work as pre-surgical testing - Prescriptions will be provided in hospital at time of discharge  Arther Abbott, PA-C Orthopedic Surgery [facility] [place]

## 2018-01-13 ENCOUNTER — Encounter (HOSPITAL_COMMUNITY): Payer: Self-pay | Admitting: Certified Registered Nurse Anesthetist

## 2018-01-13 MED ORDER — TRANEXAMIC ACID 1000 MG/10ML IV SOLN
1000.0000 mg | INTRAVENOUS | Status: AC
  Administered 2018-01-14: 1000 mg via INTRAVENOUS
  Filled 2018-01-13: qty 1100

## 2018-01-14 ENCOUNTER — Inpatient Hospital Stay (HOSPITAL_COMMUNITY): Payer: 59 | Admitting: Certified Registered Nurse Anesthetist

## 2018-01-14 ENCOUNTER — Encounter (HOSPITAL_COMMUNITY)
Admission: RE | Disposition: A | Discharge status: Home / Self Care | Payer: Self-pay | Source: Home / Self Care | Attending: Orthopedic Surgery

## 2018-01-14 ENCOUNTER — Other Ambulatory Visit: Payer: Self-pay

## 2018-01-14 ENCOUNTER — Inpatient Hospital Stay (HOSPITAL_COMMUNITY)
Admission: RE | Admit: 2018-01-14 | Discharge: 2018-01-15 | DRG: 470 | Disposition: A | Discharge status: Home / Self Care | Payer: 59 | Attending: Orthopedic Surgery | Admitting: Orthopedic Surgery

## 2018-01-14 ENCOUNTER — Inpatient Hospital Stay (HOSPITAL_COMMUNITY): Payer: 59

## 2018-01-14 ENCOUNTER — Encounter (HOSPITAL_COMMUNITY): Payer: Self-pay

## 2018-01-14 DIAGNOSIS — Z471 Aftercare following joint replacement surgery: Secondary | ICD-10-CM | POA: Diagnosis not present

## 2018-01-14 DIAGNOSIS — Z96641 Presence of right artificial hip joint: Secondary | ICD-10-CM | POA: Diagnosis not present

## 2018-01-14 DIAGNOSIS — M25751 Osteophyte, right hip: Secondary | ICD-10-CM | POA: Diagnosis present

## 2018-01-14 DIAGNOSIS — Z885 Allergy status to narcotic agent status: Secondary | ICD-10-CM

## 2018-01-14 DIAGNOSIS — E785 Hyperlipidemia, unspecified: Secondary | ICD-10-CM | POA: Diagnosis present

## 2018-01-14 DIAGNOSIS — Z87891 Personal history of nicotine dependence: Secondary | ICD-10-CM | POA: Diagnosis not present

## 2018-01-14 DIAGNOSIS — Z96649 Presence of unspecified artificial hip joint: Secondary | ICD-10-CM

## 2018-01-14 DIAGNOSIS — M1611 Unilateral primary osteoarthritis, right hip: Principal | ICD-10-CM | POA: Diagnosis present

## 2018-01-14 DIAGNOSIS — M169 Osteoarthritis of hip, unspecified: Secondary | ICD-10-CM

## 2018-01-14 DIAGNOSIS — Z8632 Personal history of gestational diabetes: Secondary | ICD-10-CM

## 2018-01-14 DIAGNOSIS — M25551 Pain in right hip: Secondary | ICD-10-CM | POA: Diagnosis not present

## 2018-01-14 HISTORY — PX: TOTAL HIP ARTHROPLASTY: SHX124

## 2018-01-14 LAB — TYPE AND SCREEN
ABO/RH(D): A POS
Antibody Screen: NEGATIVE

## 2018-01-14 SURGERY — ARTHROPLASTY, HIP, TOTAL, ANTERIOR APPROACH
Anesthesia: Monitor Anesthesia Care | Site: Hip | Laterality: Right

## 2018-01-14 MED ORDER — PROPOFOL 10 MG/ML IV BOLUS
INTRAVENOUS | Status: AC
  Filled 2018-01-14: qty 60

## 2018-01-14 MED ORDER — HYDROCODONE-ACETAMINOPHEN 7.5-325 MG PO TABS
1.0000 | ORAL_TABLET | ORAL | Status: DC | PRN
  Administered 2018-01-14 – 2018-01-15 (×4): 2 via ORAL
  Filled 2018-01-14 (×4): qty 2

## 2018-01-14 MED ORDER — MORPHINE SULFATE (PF) 2 MG/ML IV SOLN
0.5000 mg | INTRAVENOUS | Status: DC | PRN
  Administered 2018-01-14 (×4): 1 mg via INTRAVENOUS
  Filled 2018-01-14 (×3): qty 1

## 2018-01-14 MED ORDER — LACTATED RINGERS IV SOLN
INTRAVENOUS | Status: DC
  Administered 2018-01-14 (×2): via INTRAVENOUS

## 2018-01-14 MED ORDER — BUPIVACAINE-EPINEPHRINE (PF) 0.25% -1:200000 IJ SOLN
INTRAMUSCULAR | Status: DC | PRN
  Administered 2018-01-14: 30 mL

## 2018-01-14 MED ORDER — BUPIVACAINE HCL (PF) 0.75 % IJ SOLN
INTRAMUSCULAR | Status: DC | PRN
  Administered 2018-01-14: 1.8 mL via INTRATHECAL

## 2018-01-14 MED ORDER — DOCUSATE SODIUM 100 MG PO CAPS
100.0000 mg | ORAL_CAPSULE | Freq: Two times a day (BID) | ORAL | Status: DC
  Administered 2018-01-14 – 2018-01-15 (×2): 100 mg via ORAL
  Filled 2018-01-14 (×2): qty 1

## 2018-01-14 MED ORDER — ASPIRIN EC 325 MG PO TBEC
325.0000 mg | DELAYED_RELEASE_TABLET | Freq: Two times a day (BID) | ORAL | Status: DC
  Administered 2018-01-15: 325 mg via ORAL
  Filled 2018-01-14: qty 1

## 2018-01-14 MED ORDER — PHENYLEPHRINE 40 MCG/ML (10ML) SYRINGE FOR IV PUSH (FOR BLOOD PRESSURE SUPPORT)
PREFILLED_SYRINGE | INTRAVENOUS | Status: DC | PRN
  Administered 2018-01-14 (×7): 80 ug via INTRAVENOUS

## 2018-01-14 MED ORDER — ACETAMINOPHEN 10 MG/ML IV SOLN
1000.0000 mg | Freq: Four times a day (QID) | INTRAVENOUS | Status: DC
  Administered 2018-01-14: 1000 mg via INTRAVENOUS
  Filled 2018-01-14: qty 100

## 2018-01-14 MED ORDER — ONDANSETRON HCL 4 MG/2ML IJ SOLN
4.0000 mg | Freq: Four times a day (QID) | INTRAMUSCULAR | Status: AC | PRN
  Administered 2018-01-14: 4 mg via INTRAVENOUS

## 2018-01-14 MED ORDER — OXYCODONE HCL 5 MG PO TABS: 5.0000 mg | ORAL_TABLET | Freq: Once | ORAL | Status: DC | PRN

## 2018-01-14 MED ORDER — DEXAMETHASONE SODIUM PHOSPHATE 10 MG/ML IJ SOLN
8.0000 mg | Freq: Once | INTRAMUSCULAR | Status: AC
  Administered 2018-01-14: 10 mg via INTRAVENOUS

## 2018-01-14 MED ORDER — TRAMADOL HCL 50 MG PO TABS
50.0000 mg | ORAL_TABLET | Freq: Four times a day (QID) | ORAL | Status: DC | PRN
  Administered 2018-01-14 – 2018-01-15 (×3): 100 mg via ORAL
  Filled 2018-01-14 (×3): qty 2

## 2018-01-14 MED ORDER — ONDANSETRON HCL 4 MG PO TABS: 4.0000 mg | ORAL_TABLET | Freq: Four times a day (QID) | ORAL | Status: DC | PRN

## 2018-01-14 MED ORDER — PHENOL 1.4 % MT LIQD: 1.0000 | OROMUCOSAL | Status: DC | PRN

## 2018-01-14 MED ORDER — ONDANSETRON HCL 4 MG/2ML IJ SOLN: 4.0000 mg | Freq: Four times a day (QID) | INTRAMUSCULAR | Status: DC | PRN

## 2018-01-14 MED ORDER — OXYCODONE HCL 5 MG/5ML PO SOLN
5.0000 mg | Freq: Once | ORAL | Status: DC | PRN
  Filled 2018-01-14: qty 5

## 2018-01-14 MED ORDER — DIPHENHYDRAMINE HCL 12.5 MG/5ML PO ELIX: 12.5000 mg | ORAL_SOLUTION | ORAL | Status: DC | PRN

## 2018-01-14 MED ORDER — MENTHOL 3 MG MT LOZG: 1.0000 | LOZENGE | OROMUCOSAL | Status: DC | PRN

## 2018-01-14 MED ORDER — FENTANYL CITRATE (PF) 100 MCG/2ML IJ SOLN
25.0000 ug | INTRAMUSCULAR | Status: DC | PRN
  Administered 2018-01-14 (×2): 12.5 ug via INTRAVENOUS

## 2018-01-14 MED ORDER — ACETAMINOPHEN 500 MG PO TABS
500.0000 mg | ORAL_TABLET | Freq: Four times a day (QID) | ORAL | Status: AC
  Administered 2018-01-14 – 2018-01-15 (×3): 500 mg via ORAL
  Filled 2018-01-14 (×3): qty 1

## 2018-01-14 MED ORDER — POLYETHYLENE GLYCOL 3350 17 G PO PACK: 17.0000 g | PACK | Freq: Every day | ORAL | Status: DC | PRN

## 2018-01-14 MED ORDER — PROPOFOL 10 MG/ML IV BOLUS
INTRAVENOUS | Status: AC
  Filled 2018-01-14: qty 20

## 2018-01-14 MED ORDER — CHLORHEXIDINE GLUCONATE 4 % EX LIQD: 60.0000 mL | Freq: Once | CUTANEOUS | Status: DC

## 2018-01-14 MED ORDER — ONDANSETRON HCL 4 MG/2ML IJ SOLN
INTRAMUSCULAR | Status: AC
  Filled 2018-01-14: qty 2

## 2018-01-14 MED ORDER — ONDANSETRON HCL 4 MG/2ML IJ SOLN
INTRAMUSCULAR | Status: DC | PRN
  Administered 2018-01-14: 4 mg via INTRAVENOUS

## 2018-01-14 MED ORDER — CEFAZOLIN SODIUM-DEXTROSE 2-4 GM/100ML-% IV SOLN
2.0000 g | INTRAVENOUS | Status: AC
  Administered 2018-01-14: 2 g via INTRAVENOUS
  Filled 2018-01-14: qty 100

## 2018-01-14 MED ORDER — BISACODYL 10 MG RE SUPP: 10.0000 mg | Freq: Every day | RECTAL | Status: DC | PRN

## 2018-01-14 MED ORDER — MORPHINE SULFATE (PF) 2 MG/ML IV SOLN
INTRAVENOUS | Status: AC
  Filled 2018-01-14: qty 1

## 2018-01-14 MED ORDER — BUPIVACAINE-EPINEPHRINE (PF) 0.25% -1:200000 IJ SOLN
INTRAMUSCULAR | Status: AC
  Filled 2018-01-14: qty 30

## 2018-01-14 MED ORDER — HYDROCODONE-ACETAMINOPHEN 5-325 MG PO TABS
1.0000 | ORAL_TABLET | ORAL | Status: DC | PRN
  Administered 2018-01-14: 2 via ORAL
  Filled 2018-01-14: qty 2

## 2018-01-14 MED ORDER — FENTANYL CITRATE (PF) 100 MCG/2ML IJ SOLN
INTRAMUSCULAR | Status: AC
  Administered 2018-01-14: 12.5 ug via INTRAVENOUS
  Filled 2018-01-14: qty 2

## 2018-01-14 MED ORDER — DEXAMETHASONE SODIUM PHOSPHATE 10 MG/ML IJ SOLN
10.0000 mg | Freq: Once | INTRAMUSCULAR | Status: AC
  Administered 2018-01-15: 10 mg via INTRAVENOUS
  Filled 2018-01-14: qty 1

## 2018-01-14 MED ORDER — DEXTROSE 5 % IV SOLN
500.0000 mg | Freq: Four times a day (QID) | INTRAVENOUS | Status: DC | PRN
  Administered 2018-01-14: 500 mg via INTRAVENOUS
  Filled 2018-01-14: qty 550
  Filled 2018-01-14: qty 5

## 2018-01-14 MED ORDER — EPHEDRINE SULFATE-NACL 50-0.9 MG/10ML-% IV SOSY
PREFILLED_SYRINGE | INTRAVENOUS | Status: DC | PRN
  Administered 2018-01-14: 5 mg via INTRAVENOUS

## 2018-01-14 MED ORDER — FLEET ENEMA 7-19 GM/118ML RE ENEM: 1.0000 | ENEMA | Freq: Once | RECTAL | Status: DC | PRN

## 2018-01-14 MED ORDER — METOCLOPRAMIDE HCL 5 MG/ML IJ SOLN: 5.0000 mg | Freq: Three times a day (TID) | INTRAMUSCULAR | Status: DC | PRN

## 2018-01-14 MED ORDER — METHOCARBAMOL 500 MG PO TABS
500.0000 mg | ORAL_TABLET | Freq: Four times a day (QID) | ORAL | Status: DC | PRN
  Administered 2018-01-14 – 2018-01-15 (×2): 500 mg via ORAL
  Filled 2018-01-14 (×2): qty 1

## 2018-01-14 MED ORDER — SODIUM CHLORIDE 0.9 % IV SOLN
INTRAVENOUS | Status: DC
  Administered 2018-01-14: 14:00:00 via INTRAVENOUS

## 2018-01-14 MED ORDER — METOCLOPRAMIDE HCL 5 MG PO TABS: 5.0000 mg | ORAL_TABLET | Freq: Three times a day (TID) | ORAL | Status: DC | PRN

## 2018-01-14 MED ORDER — STERILE WATER FOR IRRIGATION IR SOLN
Status: DC | PRN
  Administered 2018-01-14: 2000 mL

## 2018-01-14 MED ORDER — PROPOFOL 500 MG/50ML IV EMUL
INTRAVENOUS | Status: DC | PRN
  Administered 2018-01-14: 100 ug/kg/min via INTRAVENOUS

## 2018-01-14 MED ORDER — TRANEXAMIC ACID 1000 MG/10ML IV SOLN
1000.0000 mg | Freq: Once | INTRAVENOUS | Status: AC
  Administered 2018-01-14: 1000 mg via INTRAVENOUS
  Filled 2018-01-14: qty 1100

## 2018-01-14 MED ORDER — 0.9 % SODIUM CHLORIDE (POUR BTL) OPTIME
TOPICAL | Status: DC | PRN
  Administered 2018-01-14: 1000 mL

## 2018-01-14 MED ORDER — CEFAZOLIN SODIUM-DEXTROSE 2-4 GM/100ML-% IV SOLN
2.0000 g | Freq: Four times a day (QID) | INTRAVENOUS | Status: AC
  Administered 2018-01-14 (×2): 2 g via INTRAVENOUS
  Filled 2018-01-14 (×2): qty 100

## 2018-01-14 SURGICAL SUPPLY — 38 items
BAG DECANTER FOR FLEXI CONT (MISCELLANEOUS) ×2 IMPLANT
BAG ZIPLOCK 12X15 (MISCELLANEOUS) IMPLANT
BLADE EXTENDED COATED 6.5IN (ELECTRODE) ×2 IMPLANT
BLADE SAG 18X100X1.27 (BLADE) ×2 IMPLANT
CAPT HIP TOTAL 2 ×2 IMPLANT
CLOTH BEACON ORANGE TIMEOUT ST (SAFETY) ×2 IMPLANT
COVER PERINEAL POST (MISCELLANEOUS) ×2 IMPLANT
COVER SURGICAL LIGHT HANDLE (MISCELLANEOUS) ×2 IMPLANT
DECANTER SPIKE VIAL GLASS SM (MISCELLANEOUS) ×2 IMPLANT
DRAPE STERI IOBAN 125X83 (DRAPES) ×2 IMPLANT
DRAPE U-SHAPE 47X51 STRL (DRAPES) ×4 IMPLANT
DRSG ADAPTIC 3X8 NADH LF (GAUZE/BANDAGES/DRESSINGS) ×2 IMPLANT
DRSG MEPILEX BORDER 4X4 (GAUZE/BANDAGES/DRESSINGS) ×2 IMPLANT
DRSG MEPILEX BORDER 4X8 (GAUZE/BANDAGES/DRESSINGS) ×2 IMPLANT
DURAPREP 26ML APPLICATOR (WOUND CARE) ×2 IMPLANT
ELECT REM PT RETURN 15FT ADLT (MISCELLANEOUS) ×2 IMPLANT
EVACUATOR 1/8 PVC DRAIN (DRAIN) ×2 IMPLANT
GLOVE BIO SURGEON STRL SZ7 (GLOVE) ×4 IMPLANT
GLOVE BIO SURGEON STRL SZ8 (GLOVE) ×2 IMPLANT
GLOVE BIOGEL PI IND STRL 6.5 (GLOVE) ×2 IMPLANT
GLOVE BIOGEL PI IND STRL 7.0 (GLOVE) IMPLANT
GLOVE BIOGEL PI IND STRL 8 (GLOVE) ×2 IMPLANT
GLOVE BIOGEL PI INDICATOR 6.5 (GLOVE) ×2
GLOVE BIOGEL PI INDICATOR 7.0 (GLOVE)
GLOVE BIOGEL PI INDICATOR 8 (GLOVE) ×2
GOWN STRL REUS W/TWL LRG LVL3 (GOWN DISPOSABLE) ×2 IMPLANT
GOWN STRL REUS W/TWL XL LVL3 (GOWN DISPOSABLE) ×2 IMPLANT
PACK ANTERIOR HIP CUSTOM (KITS) ×2 IMPLANT
STRIP CLOSURE SKIN 1/2X4 (GAUZE/BANDAGES/DRESSINGS) ×2 IMPLANT
SUT ETHIBOND NAB CT1 #1 30IN (SUTURE) ×2 IMPLANT
SUT MNCRL AB 4-0 PS2 18 (SUTURE) ×2 IMPLANT
SUT STRATAFIX 0 PDS 27 VIOLET (SUTURE) ×2
SUT VIC AB 2-0 CT1 27 (SUTURE) ×2
SUT VIC AB 2-0 CT1 TAPERPNT 27 (SUTURE) ×2 IMPLANT
SUTURE STRATFX 0 PDS 27 VIOLET (SUTURE) ×1 IMPLANT
SYR 50ML LL SCALE MARK (SYRINGE) IMPLANT
TRAY FOLEY CATH 14FR (SET/KITS/TRAYS/PACK) ×2 IMPLANT
YANKAUER SUCT BULB TIP 10FT TU (MISCELLANEOUS) ×2 IMPLANT

## 2018-01-14 NOTE — Anesthesia Postprocedure Evaluation (Signed)
Anesthesia Post Note  Patient: Angel Edwards  Procedure(s) Performed: RIGHT TOTAL HIP ARTHROPLASTY ANTERIOR APPROACH (Right Hip)     Patient location during evaluation: PACU Anesthesia Type: MAC and Spinal Level of consciousness: oriented and awake and alert Pain management: pain level controlled Vital Signs Assessment: post-procedure vital signs reviewed and stable Respiratory status: spontaneous breathing, respiratory function stable and patient connected to nasal cannula oxygen Cardiovascular status: blood pressure returned to baseline and stable Postop Assessment: no headache, no backache and no apparent nausea or vomiting Anesthetic complications: no    Last Vitals:  Vitals:   01/14/18 1227 01/14/18 1333  BP: 117/64 104/73  Pulse: 64 66  Resp: 16 18  Temp: 36.4 C 36.4 C  SpO2: 100% 99%    Last Pain:  Vitals:   01/14/18 1333  TempSrc: Oral  PainSc:                  Angel Edwards S

## 2018-01-14 NOTE — Anesthesia Preprocedure Evaluation (Signed)
Anesthesia Evaluation  Patient identified by MRN, date of birth, ID band Patient awake    Reviewed: Allergy & Precautions, H&P , NPO status , Patient's Chart, lab work & pertinent test results  Airway Mallampati: II   Neck ROM: full    Dental   Pulmonary former smoker,    breath sounds clear to auscultation       Cardiovascular negative cardio ROS   Rhythm:regular Rate:Normal     Neuro/Psych  Headaches,    GI/Hepatic   Endo/Other    Renal/GU      Musculoskeletal  (+) Arthritis ,   Abdominal   Peds  Hematology   Anesthesia Other Findings   Reproductive/Obstetrics                             Anesthesia Physical Anesthesia Plan  ASA: II  Anesthesia Plan: Spinal and MAC   Post-op Pain Management:    Induction: Intravenous  PONV Risk Score and Plan: 2 and Ondansetron, Dexamethasone, Propofol infusion, Midazolam and Treatment may vary due to age or medical condition  Airway Management Planned: Simple Face Mask  Additional Equipment:   Intra-op Plan:   Post-operative Plan:   Informed Consent: I have reviewed the patients History and Physical, chart, labs and discussed the procedure including the risks, benefits and alternatives for the proposed anesthesia with the patient or authorized representative who has indicated his/her understanding and acceptance.     Plan Discussed with: CRNA, Anesthesiologist and Surgeon  Anesthesia Plan Comments:         Anesthesia Quick Evaluation

## 2018-01-14 NOTE — Evaluation (Signed)
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  Pt should progress to dc home with family assist.    Follow Up Recommendations Follow surgeon's recommendation for DC plan and follow-up therapies    Equipment Recommendations  None recommended by PT    Recommendations for Other Services OT consult     Precautions / Restrictions Precautions Precautions: Fall Restrictions Weight Bearing Restrictions: No Other Position/Activity Restrictions: wbat      Mobility  Bed Mobility Overal bed mobility: Needs Assistance Bed Mobility: Supine to Sit     Supine to sit: Min assist     General bed mobility comments: cues for sequence and use of L LE to self assist  Transfers Overall transfer level: Needs assistance Equipment used: Rolling walker (2 wheeled) Transfers: Sit to/from Stand Sit to Stand: Min assist         General transfer comment: cues for LE management and use of UEs to self assist  Ambulation/Gait Ambulation/Gait assistance: Min assist Gait Distance (Feet): 55 Feet Assistive device: Rolling walker (2 wheeled) Gait Pattern/deviations: Step-to pattern;Decreased step length - right;Decreased step length - left;Shuffle;Trunk flexed Gait velocity: decr   General Gait Details: cues for sequence, posture and position from [employer]  Stairs            Wheelchair Mobility    Modified Rankin (Stroke Patients Only)       Balance Overall balance assessment: No apparent balance deficits (not formally assessed)                                           Pertinent Vitals/Pain Pain Assessment: 0-10 Pain Score: 7  Pain Location: R hip Pain Descriptors / Indicators:  Aching;Sore Pain Intervention(s): Limited activity within patient's tolerance;Monitored during session;Premedicated before session;Ice applied    Home Living Family/patient expects to be discharged to:: Private residence Living Arrangements: Spouse/significant other;Children Available Help at Discharge: Family Type of Home: House Home Access: Stairs to enter   Secretary/administratorntrance Stairs-Number of Steps: 1 Home Layout: One level Home Equipment: Environmental consultantWalker - 2 wheels;Shower seat;Crutches      Prior Function Level of Independence: Independent;Independent with assistive device(s)               Hand Dominance        Extremity/Trunk Assessment   Upper Extremity Assessment Upper Extremity Assessment: Overall WFL for tasks assessed    Lower Extremity Assessment Lower Extremity Assessment: RLE deficits/detail       Communication   Communication: No difficulties  Cognition Arousal/Alertness: Awake/alert Behavior During Therapy: WFL for tasks assessed/performed Overall Cognitive Status: Within Functional Limits for tasks assessed                                        General Comments      Exercises Total Joint Exercises Ankle Circles/Pumps: AROM;Both;15 reps;Supine   Assessment/Plan    PT Assessment Patient needs continued PT services  PT Problem List Decreased strength;Decreased range of motion;Decreased activity tolerance;Decreased mobility;Decreased knowledge of use of DME;Pain  PT Treatment Interventions DME instruction;Gait training;Stair training;Functional mobility training;Therapeutic activities;Therapeutic exercise;Patient/family education    PT Goals (Current goals can be found in the Care Plan section)  Acute Rehab PT Goals Patient Stated Goal: Regain IND PT Goal Formulation: With patient Time For Goal Achievement: 01/21/18 Potential to Achieve Goals: Good    Frequency 7X/week   Barriers to discharge        Co-evaluation                AM-PAC PT "6 Clicks" Daily Activity  Outcome Measure Difficulty turning over in bed (including adjusting bedclothes, sheets and blankets)?: Unable Difficulty moving from lying on back to sitting on the side of the bed? : Unable Difficulty sitting down on and standing up from a chair with arms (e.g., wheelchair, bedside commode, etc,.)?: A Lot Help needed moving to and from a bed to chair (including a wheelchair)?: A Little Help needed walking in hospital room?: A Little Help needed climbing 3-5 steps with a railing? : A Little 6 Click Score: 13    End of Session Equipment Utilized During Treatment: Gait belt Activity Tolerance: Patient tolerated treatment well;Patient limited by pain Patient left: in chair;with family/visitor present;with call bell/phone within reach Nurse Communication: Mobility status PT Visit Diagnosis: Difficulty in walking, not elsewhere classified (R26.2)    Time: 1191-4782 PT Time Calculation (min) (ACUTE ONLY): 30 min   Charges:   PT Evaluation $PT Eval Low Complexity: 1 Low PT Treatments $Gait Training: 8-22 mins   PT G Codes:        Pg [phone]   Traquan Duarte 01/14/2018, 5:27 PM

## 2018-01-14 NOTE — Discharge Instructions (Signed)
° °Dr. Frank Aluisio °Total Joint Specialist °[facility] °[address] °, [place] [address] °[phone] ° °ANTERIOR APPROACH TOTAL HIP REPLACEMENT POSTOPERATIVE DIRECTIONS ° ° °Hip Rehabilitation, Guidelines Following Surgery  °The results of a hip operation are greatly improved after range of motion and muscle strengthening exercises. Follow all safety measures which are given to protect your hip. If any of these exercises cause increased pain or swelling in your joint, decrease the amount until you are comfortable again. Then slowly increase the exercises. Call your caregiver if you have problems or questions.  ° °HOME CARE INSTRUCTIONS  °Remove items at home which could result in a fall. This includes throw rugs or furniture in walking pathways.  °· ICE to the affected hip every three hours for 30 minutes at a time and then as needed for pain and swelling.  Continue to use ice on the hip for pain and swelling from surgery. You may notice swelling that will progress down to the foot and ankle.  This is normal after surgery.  Elevate the leg when you are not up walking on it.   °· Continue to use the breathing machine which will help keep your temperature down.  It is common for your temperature to cycle up and down following surgery, especially at night when you are not up moving around and exerting yourself.  The breathing machine keeps your lungs expanded and your temperature down. ° ° °DIET °You may resume your previous home diet once your are discharged from the hospital. ° °DRESSING / WOUND CARE / SHOWERING °You may change your dressing every day with sterile gauze.  Please use good hand washing techniques before changing the dressing.  Do not use any lotions or creams on the incision until instructed by your surgeon. °You may start showering once you are discharged home but do not submerge the incision under water. Just pat the incision dry and apply a dry gauze dressing on  daily. °Change the surgical dressing daily and reapply a dry dressing each time. ° °ACTIVITY °Walk with your walker as instructed. °Use walker as long as suggested by your caregivers. °Avoid periods of inactivity such as sitting longer than an hour when not asleep. This helps prevent blood clots.  °You may resume a sexual relationship in one month or when given the OK by your doctor.  °You may return to work once you are cleared by your doctor.  °Do not drive a car for 6 weeks or until released by you surgeon.  °Do not drive while taking narcotics. ° °WEIGHT BEARING °Weight bearing as tolerated with assist device (walker, cane, etc) as directed, use it as long as suggested by your surgeon or therapist, typically at least 4-6 weeks. ° °POSTOPERATIVE CONSTIPATION PROTOCOL °Constipation - defined medically as fewer than three stools per week and severe constipation as less than one stool per week. ° °One of the most common issues patients have following surgery is constipation.  Even if you have a regular bowel pattern at home, your normal regimen is likely to be disrupted due to multiple reasons following surgery.  Combination of anesthesia, postoperative narcotics, change in appetite and fluid intake all can affect your bowels.  In order to avoid complications following surgery, here are some recommendations in order to help you during your recovery period. ° °Colace (docusate) - Pick up an over-the-counter form of Colace or another stool softener and take twice a day as long as you are requiring postoperative pain   medications.  Take with a full glass of water daily.  If you experience loose stools or diarrhea, hold the colace until you stool forms back up.  If your symptoms do not get better within 1 week or if they get worse, check with your doctor. ° °Dulcolax (bisacodyl) - Pick up over-the-counter and take as directed by the product packaging as needed to assist with the movement of your bowels.  Take with a full  glass of water.  Use this product as needed if not relieved by Colace only.  ° °MiraLax (polyethylene glycol) - Pick up over-the-counter to have on hand.  MiraLax is a solution that will increase the amount of water in your bowels to assist with bowel movements.  Take as directed and can mix with a glass of water, juice, soda, coffee, or tea.  Take if you go more than two days without a movement. °Do not use MiraLax more than once per day. Call your doctor if you are still constipated or irregular after using this medication for 7 days in a row. ° °If you continue to have problems with postoperative constipation, please contact the office for further assistance and recommendations.  If you experience "the worst abdominal pain ever" or develop nausea or vomiting, please contact the office immediatly for further recommendations for treatment. ° °ITCHING ° If you experience itching with your medications, try taking only a single pain pill, or even half a pain pill at a time.  You can also use Benadryl over the counter for itching or also to help with sleep.  ° °TED HOSE STOCKINGS °Wear the elastic stockings on both legs for three weeks following surgery during the day but you may remove then at night for sleeping. ° °MEDICATIONS °See your medication summary on the “After Visit Summary” that the nursing staff will review with you prior to discharge.  You may have some home medications which will be placed on hold until you complete the course of blood thinner medication.  It is important for you to complete the blood thinner medication as prescribed by your surgeon.  Continue your approved medications as instructed at time of discharge. ° °PRECAUTIONS °If you experience chest pain or shortness of breath - call 911 immediately for transfer to the hospital emergency department.  °If you develop a fever greater that 101 F, purulent drainage from wound, increased redness or drainage from wound, foul odor from the  wound/dressing, or calf pain - CONTACT YOUR SURGEON.   °                                                °FOLLOW-UP APPOINTMENTS °Make sure you keep all of your appointments after your operation with your surgeon and caregivers. You should call the office at the above phone number and make an appointment for approximately two weeks after the date of your surgery or on the date instructed by your surgeon outlined in the "After Visit Summary". ° °RANGE OF MOTION AND STRENGTHENING EXERCISES  °These exercises are designed to help you keep full movement of your hip joint. Follow your caregiver's or physical therapist's instructions. Perform all exercises about fifteen times, three times per day or as directed. Exercise both hips, even if you have had only one joint replacement. These exercises can be done on a training (exercise) mat, on the floor, on   a table or on a bed. Use whatever works the best and is most comfortable for you. Use music or television while you are exercising so that the exercises are a pleasant break in your day. This will make your life better with the exercises acting as a break in routine you can look forward to.  °Lying on your back, slowly slide your foot toward your buttocks, raising your knee up off the floor. Then slowly slide your foot back down until your leg is straight again.  °Lying on your back spread your legs as far apart as you can without causing discomfort.  °Lying on your side, raise your upper leg and foot straight up from the floor as far as is comfortable. Slowly lower the leg and repeat.  °Lying on your back, tighten up the muscle in the front of your thigh (quadriceps muscles). You can do this by keeping your leg straight and trying to raise your heel off the floor. This helps strengthen the largest muscle supporting your knee.  °Lying on your back, tighten up the muscles of your buttocks both with the legs straight and with the knee bent at a comfortable angle while keeping  your heel on the floor.  ° °IF YOU ARE TRANSFERRED TO A SKILLED REHAB FACILITY °If the patient is transferred to a skilled rehab facility following release from the hospital, a list of the current medications will be sent to the facility for the patient to continue.  When discharged from the skilled rehab facility, please have the facility set up the patient's Home Health Physical Therapy prior to being released. Also, the skilled facility will be responsible for providing the patient with their medications at time of release from the facility to include their pain medication, the muscle relaxants, and their blood thinner medication. If the patient is still at the rehab facility at time of the two week follow up appointment, the skilled rehab facility will also need to assist the patient in arranging follow up appointment in our office and any transportation needs. ° °MAKE SURE YOU:  °Understand these instructions.  °Get help right away if you are not doing well or get worse.  ° ° °Pick up stool softner and laxative for home use following surgery while on pain medications. °Do not submerge incision under water. °Please use good hand washing techniques while changing dressing each day. °May shower starting three days after surgery. °Please use a clean towel to pat the incision dry following showers. °Continue to use ice for pain and swelling after surgery. °Do not use any lotions or creams on the incision until instructed by your surgeon. °

## 2018-01-14 NOTE — Transfer of Care (Signed)
Immediate Anesthesia Transfer of Care Note  Patient: Angel Edwards  Procedure(s) Performed: RIGHT TOTAL HIP ARTHROPLASTY ANTERIOR APPROACH (Right Hip)  Patient Location: PACU  Anesthesia Type:Spinal  Level of Consciousness: awake and patient cooperative  Airway & Oxygen Therapy: Patient Spontanous Breathing and Patient connected to face mask  Post-op Assessment: Report given to RN and Post -op Vital signs reviewed and stable  Post vital signs: Reviewed and stable  Last Vitals:  Vitals Value Taken Time  BP 88/57 01/14/2018  9:51 AM  Temp    Pulse 66 01/14/2018  9:53 AM  Resp 20 01/14/2018  9:53 AM  SpO2 100 % 01/14/2018  9:53 AM  Vitals shown include unvalidated device data.  Last Pain:  Vitals:   01/14/18 0647  TempSrc: Oral  PainSc:          Complications: No apparent anesthesia complications

## 2018-01-14 NOTE — Progress Notes (Signed)
Pt accidentally pulled hemovac out from under dsg to right hip.  Minimal bleeding noted to dsg.  Output from drain 20cc.

## 2018-01-14 NOTE — Anesthesia Procedure Notes (Addendum)
Spinal  Patient location during procedure: OR Start time: 01/14/2018 8:22 AM End time: 01/14/2018 8:25 AM Staffing Anesthesiologist: Achille RichHodierne, Adam, MD Resident/CRNA: Vanessa Durhamochran, Teyla Skidgel Glenn, CRNA Performed: resident/CRNA  Preanesthetic Checklist Completed: patient identified, site marked, surgical consent, pre-op evaluation, timeout performed, IV checked, risks and benefits discussed and monitors and equipment checked Spinal Block Patient position: sitting Prep: DuraPrep Patient monitoring: continuous pulse ox, blood pressure and heart rate Approach: midline Location: L3-4 Injection technique: single-shot Needle Needle type: Pencan  Needle gauge: 24 G Needle length: 10 cm Needle insertion depth: 7 cm Assessment Sensory level: T6 Additional Notes Expiration dates checked, first pass csf, tolerated without difficulty

## 2018-01-14 NOTE — Op Note (Signed)
OPERATIVE REPORT- TOTAL HIP ARTHROPLASTY   PREOPERATIVE DIAGNOSIS: Osteoarthritis of the Right hip.   POSTOPERATIVE DIAGNOSIS: Osteoarthritis of the Right  hip.   PROCEDURE: Right total hip arthroplasty, anterior approach.   SURGEON: Ollen Gross, MD   ASSISTANT: Dimitri Ped, PA-C  ANESTHESIA:  Spinal  ESTIMATED BLOOD LOSS:-300 mL    DRAINS: Hemovac x1.   COMPLICATIONS: None   CONDITION: PACU - hemodynamically stable.   BRIEF CLINICAL NOTE: Angel Edwards is a 62 y.o. female who has advanced end-  stage arthritis of their Right  hip with progressively worsening pain and  dysfunction.The patient has failed nonoperative management and presents for  total hip arthroplasty.   PROCEDURE IN DETAIL: After successful administration of spinal  anesthetic, the traction boots for the [facility] bed were placed on both  feet and the patient was placed onto the [facility] bed, boots placed into the leg  holders. The Right hip was then isolated from the perineum with plastic  drapes and prepped and draped in the usual sterile fashion. ASIS and  greater trochanter were marked and a oblique incision was made, starting  at about 1 cm lateral and 2 cm distal to the ASIS and coursing towards  the anterior cortex of the femur. The skin was cut with a 10 blade  through subcutaneous tissue to the level of the fascia overlying the  tensor fascia lata muscle. The fascia was then incised in line with the  incision at the junction of the anterior third and posterior 2/3rd. The  muscle was teased off the fascia and then the interval between the TFL  and the rectus was developed. The Hohmann retractor was then placed at  the top of the femoral neck over the capsule. The vessels overlying the  capsule were cauterized and the fat on top of the capsule was removed.  A Hohmann retractor was then placed anterior underneath the rectus  femoris to give exposure to the entire anterior capsule. A T-shaped   capsulotomy was performed. The edges were tagged and the femoral head  was identified.       Osteophytes are removed off the superior acetabulum.  The femoral neck was then cut in situ with an oscillating saw. Traction  was then applied to the left lower extremity utilizing the [facility]  traction. The femoral head was then removed. Retractors were placed  around the acetabulum and then circumferential removal of the labrum was  performed. Osteophytes were also removed. Reaming starts at 47 mm to  medialize and  Increased in 2 mm increments to 51 mm. We reamed in  approximately 40 degrees of abduction, 20 degrees anteversion. A 52 mm  pinnacle acetabular shell was then impacted in anatomic position under  fluoroscopic guidance with excellent purchase. We did not need to place  any additional dome screws. A 32 mm neutral + 4 marathon liner was then  placed into the acetabular shell.       The femoral lift was then placed along the lateral aspect of the femur  just distal to the vastus ridge. The leg was  externally rotated and capsule  was stripped off the inferior aspect of the femoral neck down to the  level of the lesser trochanter, this was done with electrocautery. The femur was lifted after this was performed. The  leg was then placed in an extended and adducted position essentially delivering the femur. We also removed the capsule superiorly and the piriformis from the piriformis  fossa to gain excellent exposure of the  proximal femur. Rongeur was used to remove some cancellous bone to get  into the lateral portion of the proximal femur for placement of the  initial starter reamer. The starter broaches was placed  the starter broach  and was shown to go down the center of the canal. Broaching  with the Actis system was then performed starting at size 0, coursing  Up to size 3. A size 3 had excellent torsional and rotational  and axial stability. The trial high offset neck was then placed   with a 32 + 9 trial head. The hip was then reduced. We confirmed that  the stem was in the canal both on AP and lateral x-rays. It also has excellent sizing. The hip was reduced with outstanding stability through full extension and full external rotation.. AP pelvis was taken and the leg lengths were measured and found to be equal. Hip was then dislocated again and the femoral head and neck removed. The  femoral broach was removed. Size 3 Actis stem with a standard offset  neck was then impacted into the femur following native anteversion. Has  excellent purchase in the canal. Excellent torsional and rotational and  axial stability. It is confirmed to be in the canal on AP and lateral  fluoroscopic views. The 32 + 9 ceramic head was placed and the hip  reduced with outstanding stability. Again AP pelvis was taken and it  confirmed that the leg lengths were equal. The wound was then copiously  irrigated with saline solution and the capsule reattached and repaired  with Ethibond suture. 30 ml of .25% Bupivicaine was  injected into the capsule and into the edge of the tensor fascia lata as well as subcutaneous tissue. The fascia overlying the tensor fascia lata was then closed with a running #1 V-Loc. Subcu was closed with interrupted 2-0 Vicryl and subcuticular running 4-0 Monocryl. Incision was cleaned  and dried. Steri-Strips and a bulky sterile dressing applied. Hemovac  drain was hooked to suction and then the patient was awakened and transported to  recovery in stable condition.        Please note that a surgical assistant was a medical necessity for this procedure to perform it in a safe and expeditious manner. Assistant was necessary to provide appropriate retraction of vital neurovascular structures and to prevent femoral fracture and allow for anatomic placement of the prosthesis.  Ollen GrossFrank Tashan Kreitzer, M.D.

## 2018-01-14 NOTE — Interval H&P Note (Signed)
History and Physical Interval Note:  01/14/2018 8:09 AM  Angel Edwards  has presented today for surgery, with the diagnosis of Osteoarthritis Right Hip  The various methods of treatment have been discussed with the patient and family. After consideration of risks, benefits and other options for treatment, the patient has consented to  Procedure(s): RIGHT TOTAL HIP ARTHROPLASTY ANTERIOR APPROACH (Right) as a surgical intervention .  The patient's history has been reviewed, patient examined, no change in status, stable for surgery.  I have reviewed the patient's chart and labs.  Questions were answered to the patient's satisfaction.     Homero FellersFrank Albertia Carvin

## 2018-01-15 ENCOUNTER — Encounter (HOSPITAL_COMMUNITY): Payer: Self-pay | Admitting: Orthopedic Surgery

## 2018-01-15 LAB — BASIC METABOLIC PANEL
ANION GAP: 8 (ref 5–15)
BUN: 10 mg/dL (ref 8–23)
CO2: 30 mmol/L (ref 22–32)
Calcium: 9.1 mg/dL (ref 8.9–10.3)
Chloride: 102 mmol/L (ref 98–111)
Creatinine, Ser: 0.7 mg/dL (ref 0.44–1.00)
Glucose, Bld: 154 mg/dL — ABNORMAL HIGH (ref 70–99)
POTASSIUM: 4.2 mmol/L (ref 3.5–5.1)
Sodium: 140 mmol/L (ref 135–145)

## 2018-01-15 LAB — CBC
HEMATOCRIT: 35.2 % — AB (ref 36.0–46.0)
Hemoglobin: 11.4 g/dL — ABNORMAL LOW (ref 12.0–15.0)
MCH: 32.5 pg (ref 26.0–34.0)
MCHC: 32.4 g/dL (ref 30.0–36.0)
MCV: 100.3 fL — ABNORMAL HIGH (ref 78.0–100.0)
Platelets: 308 10*3/uL (ref 150–400)
RBC: 3.51 MIL/uL — AB (ref 3.87–5.11)
RDW: 12.2 % (ref 11.5–15.5)
WBC: 17.6 10*3/uL — AB (ref 4.0–10.5)

## 2018-01-15 MED ORDER — TRAMADOL HCL 50 MG PO TABS: 50.0000 mg | ORAL_TABLET | Freq: Four times a day (QID) | ORAL | 0 refills | Status: DC | PRN

## 2018-01-15 MED ORDER — HYDROCODONE-ACETAMINOPHEN 5-325 MG PO TABS: 1.0000 | ORAL_TABLET | Freq: Four times a day (QID) | ORAL | 0 refills | Status: DC | PRN

## 2018-01-15 MED ORDER — ASPIRIN 325 MG PO TBEC: 325.0000 mg | DELAYED_RELEASE_TABLET | Freq: Two times a day (BID) | ORAL | 0 refills | Status: AC

## 2018-01-15 MED ORDER — METHOCARBAMOL 500 MG PO TABS: 500.0000 mg | ORAL_TABLET | Freq: Four times a day (QID) | ORAL | 0 refills | Status: AC | PRN

## 2018-01-15 NOTE — Progress Notes (Signed)
   Subjective: 1 Day Post-Op Procedure(s) (LRB): RIGHT TOTAL HIP ARTHROPLASTY ANTERIOR APPROACH (Right) Patient reports pain as mild.   Patient seen in rounds by Dr. Lequita HaltAluisio. Patient is well, and has had no acute complaints or problems other than pain in the right hip. Did well ambulating with therapy yesterday. States she feels ready to go home today. Foley catheter removed this AM. Denies chest pain or SOB. We will continue working with therapy today.   Objective: Vital signs in last 24 hours: Temp:  [97.4 F (36.3 C)-98.1 F (36.7 C)] 98.1 F (36.7 C) (07/11 0647) Pulse Rate:  [55-79] 59 (07/11 0647) Resp:  [12-21] 16 (07/11 0230) BP: (93-121)/(60-79) 110/67 (07/11 0647) SpO2:  [94 %-100 %] 99 % (07/11 0647)  Intake/Output from previous day:  Intake/Output Summary (Last 24 hours) at 01/15/2018 0715 Last data filed at 01/15/2018 0600 Gross per 24 hour  Intake 3586.25 ml  Output 3425 ml  Net 161.25 ml    Labs: Recent Labs    01/15/18 0544  HGB 11.4*   Recent Labs    01/15/18 0544  WBC 17.6*  RBC 3.51*  HCT 35.2*  PLT 308   Recent Labs    01/15/18 0544  NA 140  K 4.2  CL 102  CO2 30  BUN 10  CREATININE 0.70  GLUCOSE 154*  CALCIUM 9.1    Exam: General - Patient is Alert and Oriented Extremity - Neurologically intact Neurovascular intact Sensation intact distally Dorsiflexion/Plantar flexion intact Dressing - dressing C/D/I Motor Function - intact, moving foot and toes well on exam.   Past Medical History:  Diagnosis Date  . History of gestational diabetes   . Hyperlipidemia   . Migraines   . OA (osteoarthritis)    right hip    Assessment/Plan: 1 Day Post-Op Procedure(s) (LRB): RIGHT TOTAL HIP ARTHROPLASTY ANTERIOR APPROACH (Right) Principal Problem:   OA (osteoarthritis) of hip  Estimated body mass index is 31.76 kg/m as calculated from the following:   Height as of this encounter: 5\' 4"  (1.626 m).   Weight as of this encounter: 83.9 kg  (185 lb). Advance diet Up with therapy D/C IV fluids  DVT Prophylaxis - Aspirin Weight bearing as tolerated. D/C O2 and pulse ox and try on room air. Hemovac pulled without difficulty, will continue working with therapy.  Plan is to go Home after hospital stay. Plan for discharge today if meeting goals with therapy with home exercise program. Follow-up in the office in 2 weeks with Dr. Lequita HaltAluisio.  Arther AbbottKristie Nayelly Laughman, PA-C Orthopedic Surgery 01/15/2018, 7:15 AM

## 2018-01-15 NOTE — Progress Notes (Signed)
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   Mild unsteady gait.  Minimal c/o pain.  Returned to room and performed some THR TE's following handout HEP.  Pt plans to D/C to home later today.    Follow Up Recommendations  Follow surgeon's recommendation for DC plan and follow-up therapies;Other (comment)(HEP)     Equipment Recommendations  None recommended by PT    Recommendations for Other Services       Precautions / Restrictions Precautions Precautions: Fall Restrictions Weight Bearing Restrictions: No Other Position/Activity Restrictions: WBAT    Mobility  Bed Mobility Overal bed mobility: Needs Assistance Bed Mobility: Supine to Sit     Supine to sit: Min assist;Min guard     General bed mobility comments: cues for sequence and use of L LE to self assist  Transfers Overall transfer level: Needs assistance Equipment used: Rolling walker (2 wheeled) Transfers: Sit to/from Stand Sit to Stand: Min assist;Min guard         General transfer comment: 25% VC's on safety with turns   Ambulation/Gait Ambulation/Gait assistance: Min assist;Min guard Gait Distance (Feet): 65 Feet Assistive device: Rolling walker (2 wheeled) Gait Pattern/deviations: Step-to pattern;Decreased step length - right;Decreased step length - left;Shuffle;Trunk flexed Gait velocity: decreased   General Gait Details: 25% VC's on safety with turns with walker    Stairs             Wheelchair Mobility    Modified Rankin (Stroke Patients Only)       Balance                                            Cognition Arousal/Alertness: Awake/alert Behavior During Therapy: WFL for tasks assessed/performed Overall Cognitive Status: Within Functional  Limits for tasks assessed                                        Exercises   Total Hip Replacement TE's 10 reps ankle pumps 10 reps knee presses 10 reps heel slides 10 reps SAQ's 10 reps ABD 10 reps LAQ's Followed by ICE     General Comments        Pertinent Vitals/Pain Pain Assessment: Faces Pain Score: 2  Pain Location: R hip Pain Descriptors / Indicators: Aching;Sore Pain Intervention(s): Monitored during session;Repositioned;Premedicated before session;Ice applied    Home Living                      Prior Function            PT Goals (current goals can now be found in the care plan section) Progress towards PT goals: Progressing toward goals    Frequency    7X/week      PT Plan Current plan remains appropriate    Co-evaluation              AM-PAC PT "6 Clicks" Daily Activity  Outcome Measure  Difficulty turning over in bed (including adjusting bedclothes, sheets and blankets)?: A Little Difficulty moving from lying  on back to sitting on the side of the bed? : A Little Difficulty sitting down on and standing up from a chair with arms (e.g., wheelchair, bedside commode, etc,.)?: A Little Help needed moving to and from a bed to chair (including a wheelchair)?: A Little Help needed walking in hospital room?: A Little Help needed climbing 3-5 steps with a railing? : A Lot 6 Click Score: 17    End of Session Equipment Utilized During Treatment: Gait belt Activity Tolerance: Patient tolerated treatment well;Patient limited by pain Patient left: in chair;with family/visitor present;with call bell/phone within reach Nurse Communication: Mobility status PT Visit Diagnosis: Difficulty in walking, not elsewhere classified (R26.2)     Time: 1610-9604 PT Time Calculation (min) (ACUTE ONLY): 29 min  Charges:  $Gait Training: 8-22 mins $Therapeutic Exercise: 8-22 mins                    G Codes:       Felecia Shelling  PTA [facility]   Acute  Rehab Pager      [phone]

## 2018-01-15 NOTE — Discharge Summary (Signed)
Physician Discharge Summary   Patient ID: Angel Edwards MRN: [id number] DOB/AGE: 1956-04-27 62 y.o.  Admit date: 01/14/2018 Discharge date: 01/15/2018  Primary Diagnosis: Osteoarthritis, right hip   Admission Diagnoses:  Past Medical History:  Diagnosis Date  . History of gestational diabetes   . Hyperlipidemia   . Migraines   . OA (osteoarthritis)    right hip   Discharge Diagnoses:   Principal Problem:   OA (osteoarthritis) of hip  Estimated body mass index is 31.76 kg/m as calculated from the following:   Height as of this encounter: '5\' 4"'$  (1.626 m).   Weight as of this encounter: 83.9 kg (185 lb).  Procedure(s) (LRB): RIGHT TOTAL HIP ARTHROPLASTY ANTERIOR APPROACH (Right)   Consults: None  HPI: Angel Edwards is a 62 y.o. female who has advanced end-  stage arthritis of their Right  hip with progressively worsening pain and  dysfunction.The patient has failed nonoperative management and presents for  total hip arthroplasty.   Laboratory Data: Admission on 01/14/2018, Discharged on 01/15/2018  Component Date Value Ref Range Status  . WBC 01/15/2018 17.6* 4.0 - 10.5 K/uL Final  . RBC 01/15/2018 3.51* 3.87 - 5.11 MIL/uL Final  . Hemoglobin 01/15/2018 11.4* 12.0 - 15.0 g/dL Final  . HCT 01/15/2018 35.2* 36.0 - 46.0 % Final  . MCV 01/15/2018 100.3* 78.0 - 100.0 fL Final  . MCH 01/15/2018 32.5  26.0 - 34.0 pg Final  . MCHC 01/15/2018 32.4  30.0 - 36.0 g/dL Final  . RDW 01/15/2018 12.2  11.5 - 15.5 % Final  . Platelets 01/15/2018 308  150 - 400 K/uL Final   Performed at [facility], [place] [address], [place], [place] [address]  . Sodium 01/15/2018 140  135 - 145 mmol/L Final  . Potassium 01/15/2018 4.2  3.5 - 5.1 mmol/L Final  . Chloride 01/15/2018 102  98 - 111 mmol/L Final   Please note change in reference range.  . CO2 01/15/2018 30  22 - 32 mmol/L Final  . Glucose, Bld 01/15/2018 154* 70 - 99 mg/dL Final   Please note change in reference  range.  . BUN 01/15/2018 10  8 - 23 mg/dL Final   Please note change in reference range.  . Creatinine, Ser 01/15/2018 0.70  0.44 - 1.00 mg/dL Final  . Calcium 01/15/2018 9.1  8.9 - 10.3 mg/dL Final  . GFR calc non Af Amer 01/15/2018 >60  >60 mL/min Final  . GFR calc Af Amer 01/15/2018 >60  >60 mL/min Final   Comment: (NOTE) The eGFR has been calculated using the CKD EPI equation. This calculation has not been validated in all clinical situations. eGFR's persistently <60 mL/min signify possible Chronic Kidney Disease.   Georgiann Hahn gap 01/15/2018 8  5 - 15 Final   Performed at [facility], [place] [address], [place], [place] [address]  Hospital Outpatient Visit on 01/05/2018  Component Date Value Ref Range Status  . MRSA, PCR 01/05/2018 NEGATIVE  NEGATIVE Final  . Staphylococcus aureus 01/05/2018 NEGATIVE  NEGATIVE Final   Comment: (NOTE) The Xpert SA Assay (FDA approved for NASAL specimens in patients 22 years of age and older), is one component of a comprehensive surveillance program. It is not intended to diagnose infection nor to guide or monitor treatment. Performed at [facility], [place] [address], [place], [place] [address]   . aPTT 01/05/2018 24  24 - 36 seconds Final   Performed at [facility], [address] [place], [place] [address]  . WBC 01/05/2018 6.4  4.0 - 10.5 K/uL Final  . RBC 01/05/2018 4.39  3.87 - 5.11 MIL/uL Final  . Hemoglobin 01/05/2018 14.1  12.0 - 15.0 g/dL Final  . HCT 01/05/2018 43.2  36.0 - 46.0 % Final  . MCV 01/05/2018 98.4  78.0 - 100.0 fL Final  . MCH 01/05/2018 32.1  26.0 - 34.0 pg Final  . MCHC 01/05/2018 32.6  30.0 - 36.0 g/dL Final  . RDW 01/05/2018 12.4  11.5 - 15.5 % Final  . Platelets 01/05/2018 359  150 - 400 K/uL Final   Performed at [facility], [place] [address], [place], [place] [address]  . Sodium 01/05/2018 141  135 - 145 mmol/L Final  . Potassium 01/05/2018 4.2   3.5 - 5.1 mmol/L Final  . Chloride 01/05/2018 106  98 - 111 mmol/L Final   Please note change in reference range.  . CO2 01/05/2018 27  22 - 32 mmol/L Final  . Glucose, Bld 01/05/2018 115* 70 - 99 mg/dL Final   Please note change in reference range.  . BUN 01/05/2018 14  8 - 23 mg/dL Final   Please note change in reference range.  . Creatinine, Ser 01/05/2018 0.64  0.44 - 1.00 mg/dL Final  . Calcium 01/05/2018 9.3  8.9 - 10.3 mg/dL Final  . Total Protein 01/05/2018 7.5  6.5 - 8.1 g/dL Final  . Albumin 01/05/2018 4.1  3.5 - 5.0 g/dL Final  . AST 01/05/2018 16  15 - 41 U/L Final  . ALT 01/05/2018 17  0 - 44 U/L Final   Please note change in reference range.  . Alkaline Phosphatase 01/05/2018 53  38 - 126 U/L Final  . Total Bilirubin 01/05/2018 1.0  0.3 - 1.2 mg/dL Final  . GFR calc non Af Amer 01/05/2018 >60  >60 mL/min Final  . GFR calc Af Amer 01/05/2018 >60  >60 mL/min Final   Comment: (NOTE) The eGFR has been calculated using the CKD EPI equation. This calculation has not been validated in all clinical situations. eGFR's persistently <60 mL/min signify possible Chronic Kidney Disease.   Georgiann Hahn gap 01/05/2018 8  5 - 15 Final   Performed at [facility], [place] [address], [place], [place] [address]  . Prothrombin Time 01/05/2018 12.6  11.4 - 15.2 seconds Final  . INR 01/05/2018 0.96   Final   Performed at [facility], [place] [address], [place], [place] [address]  . ABO/RH(D) 01/05/2018 A POS   Final  . Antibody Screen 01/05/2018 NEG   Final  . Sample Expiration 01/05/2018 01/17/2018   Final  . Extend sample reason 01/05/2018    Final                   Value:NO TRANSFUSIONS OR PREGNANCY IN THE PAST 3 MONTHS Performed at [facility], [place] [address] [place], [place] [address]   . Color, Urine 01/05/2018 STRAW* YELLOW Final  . APPearance 01/05/2018 CLEAR  CLEAR Final  . Specific Gravity, Urine 01/05/2018 1.005  1.005 - 1.030  Final  . pH 01/05/2018 7.0  5.0 - 8.0 Final  . Glucose, UA 01/05/2018 NEGATIVE  NEGATIVE mg/dL Final  . Hgb urine dipstick 01/05/2018 NEGATIVE  NEGATIVE Final  . Bilirubin Urine 01/05/2018 NEGATIVE  NEGATIVE Final  . Ketones, ur 01/05/2018 NEGATIVE  NEGATIVE mg/dL Final  . Protein, ur 01/05/2018 NEGATIVE  NEGATIVE mg/dL Final  . Nitrite  01/05/2018 NEGATIVE  NEGATIVE Final  . Leukocytes, UA 01/05/2018 NEGATIVE  NEGATIVE Final   Performed at [place] [address] [place], [place] [address]     X-Rays:Dg Pelvis Portable  Result Date: 01/14/2018 CLINICAL DATA:  Status post right hip joint prosthesis placement. EXAM: PORTABLE PELVIS 1-2 VIEWS COMPARISON:  Fluoroscopic views of the right hip of today's date and portable view of the pelvis of January 30, 2010 FINDINGS: The patient has undergone right hip joint prosthesis placement. Radiographic positioning of the prosthetic components is good. The interface with the native bone appears normal. There is no acute native bone abnormality. A surgical drain line is present. 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 IMPRESSION: No immediate complication following right hip joint prosthesis placement. Electronically Signed   By: David  Martinique M.D.   On: 01/14/2018 10:15   Dg C-arm 1-60 Min-no Report  Result Date: 01/14/2018 Fluoroscopy was utilized by the requesting physician.  No radiographic interpretation.    EKG:No orders found for this or any previous visit.   Hospital Course: Patient was admitted to [facility] and taken to the OR and underwent the above state procedure without complications.  Patient tolerated the procedure well and was later transferred to the recovery room and then to the orthopaedic floor for postoperative care.  They were given PO and IV analgesics for pain control following their surgery.  They were given 24 hours of postoperative antibiotics of  Anti-infectives (From admission,  onward)   Start     Dose/Rate Route Frequency Ordered Stop   01/14/18 1400  ceFAZolin (ANCEF) IVPB 2g/100 mL premix     2 g 200 mL/hr over 30 Minutes Intravenous Every 6 hours 01/14/18 1133 01/14/18 2024   01/14/18 0630  ceFAZolin (ANCEF) IVPB 2g/100 mL premix     2 g 200 mL/hr over 30 Minutes Intravenous On call to O.R. 01/14/18 3664 01/14/18 0837     and started on DVT prophylaxis in the form of Aspirin.   PT and OT were ordered for total hip protocol.  The patient was allowed to be WBAT with therapy. Discharge planning was consulted to help with postop disposition and equipment needs.  Patient had a good night on the evening of surgery.  They started to get up OOB with therapy on day one. Patient was seen during rounds on POD #1 and she was ready to go home pending progress with therapy. Hemovac drain was pulled without difficulty. She completed two additional sessions of therapy on POD #1 and was meeting her goals. She was discharged to home in stable condition later that day.   Diet: Regular diet Activity:WBAT Follow-up:in 2 weeks with Dr. Wynelle Link Disposition - Home with HEP Discharged Condition: stable   Discharge Instructions    Call MD / Call 911   Complete by:  As directed    If you experience chest pain or shortness of breath, CALL 911 and be transported to the hospital emergency room.  If you develope a fever above 101 F, pus (white drainage) or increased drainage or redness at the wound, or calf pain, call your surgeon's office.   Change dressing   Complete by:  As directed    You may change your dressing on Friday (01/16/18), then change the dressing daily with sterile 4 x 4 inch gauze dressing and paper tape.  You may clean the incision with alcohol prior to redressing   Constipation Prevention   Complete by:  As  directed    Drink plenty of fluids.  Prune juice may be helpful.  You may use a stool softener, such as Colace (over the counter) 100 mg twice a day.  Use MiraLax  (over the counter) for constipation as needed.   Diet - low sodium heart healthy   Complete by:  As directed    Discharge instructions   Complete by:  As directed    Dr. Gaynelle Arabian Total Joint Specialist [facility] [address], [place],  [address] [phone]  ANTERIOR APPROACH TOTAL HIP REPLACEMENT POSTOPERATIVE DIRECTIONS   Hip Rehabilitation, Guidelines Following Surgery  The results of a hip operation are greatly improved after range of motion and muscle strengthening exercises. Follow all safety measures which are given to protect your hip. If any of these exercises cause increased pain or swelling in your joint, decrease the amount until you are comfortable again. Then slowly increase the exercises. Call your caregiver if you have problems or questions.   HOME CARE INSTRUCTIONS  Remove items at home which could result in a fall. This includes throw rugs or furniture in walking pathways.  ICE to the affected hip every three hours for 30 minutes at a time and then as needed for pain and swelling.  Continue to use ice on the hip for pain and swelling from surgery. You may notice swelling that will progress down to the foot and ankle.  This is normal after surgery.  Elevate the leg when you are not up walking on it.   Continue to use the breathing machine which will help keep your temperature down.  It is common for your temperature to cycle up and down following surgery, especially at night when you are not up moving around and exerting yourself.  The breathing machine keeps your lungs expanded and your temperature down.  DIET You may resume your previous home diet once your are discharged from the hospital.  DRESSING / WOUND CARE / SHOWERING You may shower 3 days after surgery, but keep the wounds dry during showering.  You may use an occlusive plastic wrap (Press'n Seal for example), NO SOAKING/SUBMERGING IN THE BATHTUB.  If the bandage gets wet, change with  a clean dry gauze.  If the incision gets wet, pat the wound dry with a clean towel. You may start showering once you are discharged home but do not submerge the incision under water. Just pat the incision dry and apply a dry gauze dressing on daily. Change the surgical dressing daily and reapply a dry dressing each time.  ACTIVITY Walk with your walker as instructed. Use walker as long as suggested by your caregivers. Avoid periods of inactivity such as sitting longer than an hour when not asleep. This helps prevent blood clots.  You may resume a sexual relationship in one month or when given the OK by your doctor.  You may return to work once you are cleared by your doctor.  Do not drive a car for 6 weeks or until released by you surgeon.  Do not drive while taking narcotics.  WEIGHT BEARING Weight bearing as tolerated with assist device (walker, cane, etc) as directed, use it as long as suggested by your surgeon or therapist, typically at least 4-6 weeks.  POSTOPERATIVE CONSTIPATION PROTOCOL Constipation - defined medically as fewer than three stools per week and severe constipation as less than one stool per week.  One of the most common issues patients have following surgery is constipation.  Even  if you have a regular bowel pattern at home, your normal regimen is likely to be disrupted due to multiple reasons following surgery.  Combination of anesthesia, postoperative narcotics, change in appetite and fluid intake all can affect your bowels.  In order to avoid complications following surgery, here are some recommendations in order to help you during your recovery period.  Colace (docusate) - Pick up an over-the-counter form of Colace or another stool softener and take twice a day as long as you are requiring postoperative pain medications.  Take with a full glass of water daily.  If you experience loose stools or diarrhea, hold the colace until you stool forms back up.  If your symptoms do  not get better within 1 week or if they get worse, check with your doctor.  Dulcolax (bisacodyl) - Pick up over-the-counter and take as directed by the product packaging as needed to assist with the movement of your bowels.  Take with a full glass of water.  Use this product as needed if not relieved by Colace only.   MiraLax (polyethylene glycol) - Pick up over-the-counter to have on hand.  MiraLax is a solution that will increase the amount of water in your bowels to assist with bowel movements.  Take as directed and can mix with a glass of water, juice, soda, coffee, or tea.  Take if you go more than two days without a movement. Do not use MiraLax more than once per day. Call your doctor if you are still constipated or irregular after using this medication for 7 days in a row.  If you continue to have problems with postoperative constipation, please contact the office for further assistance and recommendations.  If you experience "the worst abdominal pain ever" or develop nausea or vomiting, please contact the office immediatly for further recommendations for treatment.  ITCHING  If you experience itching with your medications, try taking only a single pain pill, or even half a pain pill at a time.  You can also use Benadryl over the counter for itching or also to help with sleep.   TED HOSE STOCKINGS Wear the elastic stockings on both legs for three weeks following surgery during the day but you may remove then at night for sleeping.  MEDICATIONS See your medication summary on the "After Visit Summary" that the nursing staff will review with you prior to discharge.  You may have some home medications which will be placed on hold until you complete the course of blood thinner medication.  It is important for you to complete the blood thinner medication as prescribed by your surgeon.  Continue your approved medications as instructed at time of discharge.  PRECAUTIONS If you experience chest pain  or shortness of breath - call 911 immediately for transfer to the hospital emergency department.  If you develop a fever greater that 101 F, purulent drainage from wound, increased redness or drainage from wound, foul odor from the wound/dressing, or calf pain - CONTACT YOUR SURGEON.                                                   FOLLOW-UP APPOINTMENTS Make sure you keep all of your appointments after your operation with your surgeon and caregivers. You should call the office at the above phone number and make an appointment for approximately two  weeks after the date of your surgery or on the date instructed by your surgeon outlined in the "After Visit Summary".  RANGE OF MOTION AND STRENGTHENING EXERCISES  These exercises are designed to help you keep full movement of your hip joint. Follow your caregiver's or physical therapist's instructions. Perform all exercises about fifteen times, three times per day or as directed. Exercise both hips, even if you have had only one joint replacement. These exercises can be done on a training (exercise) mat, on the floor, on a table or on a bed. Use whatever works the best and is most comfortable for you. Use music or television while you are exercising so that the exercises are a pleasant break in your day. This will make your life better with the exercises acting as a break in routine you can look forward to.  Lying on your back, slowly slide your foot toward your buttocks, raising your knee up off the floor. Then slowly slide your foot back down until your leg is straight again.  Lying on your back spread your legs as far apart as you can without causing discomfort.  Lying on your side, raise your upper leg and foot straight up from the floor as far as is comfortable. Slowly lower the leg and repeat.  Lying on your back, tighten up the muscle in the front of your thigh (quadriceps muscles). You can do this by keeping your leg straight and trying to raise your  heel off the floor. This helps strengthen the largest muscle supporting your knee.  Lying on your back, tighten up the muscles of your buttocks both with the legs straight and with the knee bent at a comfortable angle while keeping your heel on the floor.   IF YOU ARE TRANSFERRED TO A SKILLED REHAB FACILITY If the patient is transferred to a skilled rehab facility following release from the hospital, a list of the current medications will be sent to the facility for the patient to continue.  When discharged from the skilled rehab facility, please have the facility set up the patient's [place] prior to being released. Also, the skilled facility will be responsible for providing the patient with their medications at time of release from the facility to include their pain medication, the muscle relaxants, and their blood thinner medication. If the patient is still at the rehab facility at time of the two week follow up appointment, the skilled rehab facility will also need to assist the patient in arranging follow up appointment in our office and any transportation needs.  MAKE SURE YOU:  Understand these instructions.  Get help right away if you are not doing well or get worse.    Pick up stool softner and laxative for home use following surgery while on pain medications. Do not submerge incision under water. Please use good hand washing techniques while changing dressing each day. May shower starting three days after surgery. Please use a clean towel to pat the incision dry following showers. Continue to use ice for pain and swelling after surgery. Do not use any lotions or creams on the incision until instructed by your surgeon.   Do not sit on low chairs, stoools or toilet seats, as it may be difficult to get up from low surfaces   Complete by:  As directed    Driving restrictions   Complete by:  As directed    No driving for two weeks   TED hose   Complete by:  As  directed    Use stockings (TED hose) for three weeks on both leg(s).  You may remove them at night for sleeping.   Weight bearing as tolerated   Complete by:  As directed      Allergies as of 01/15/2018      Reactions   Demerol [meperidine Hcl] Nausea And Vomiting   Lidocaine Other (See Comments)   Body absorbs medication too fast, that it wears off quickly      Medication List    STOP taking these medications   acetaminophen 650 MG CR tablet Commonly known as:  TYLENOL   BIOFREEZE EX   ibuprofen 200 MG tablet Commonly known as:  ADVIL,MOTRIN   naproxen sodium 220 MG tablet Commonly known as:  ALEVE     TAKE these medications   aspirin 325 MG EC tablet Take 1 tablet (325 mg total) by mouth 2 (two) times daily for 20 days. Take one tablet (325 mg) Aspirin two times a day for three weeks following surgery. Then take one baby Aspirin (81 mg) once a day for three weeks. Then discontinue aspirin.   HYDROcodone-acetaminophen 5-325 MG tablet Commonly known as:  NORCO/VICODIN Take 1-2 tablets by mouth every 6 (six) hours as needed for moderate pain or severe pain.   methocarbamol 500 MG tablet Commonly known as:  ROBAXIN Take 1 tablet (500 mg total) by mouth every 6 (six) hours as needed for muscle spasms.   traMADol 50 MG tablet Commonly known as:  ULTRAM Take 1-2 tablets (50-100 mg total) by mouth every 6 (six) hours as needed for moderate pain (not relieved by hydrocodone).            Durable Medical Equipment  (From admission, onward)        Start     Ordered   01/15/18 0917  For home use only DME Bedside commode  Once    Question:  Patient needs a bedside commode to treat with the following condition  Answer:  Surgery, elective   01/15/18 0917       Discharge Care Instructions  (From admission, onward)        Start     Ordered   01/15/18 0000  Weight bearing as tolerated     01/15/18 0719   01/15/18 0000  Change dressing    Comments:  You may  change your dressing on Friday (01/16/18), then change the dressing daily with sterile 4 x 4 inch gauze dressing and paper tape.  You may clean the incision with alcohol prior to redressing   01/15/18 0719     Follow-up Information    Gaynelle Arabian, MD. Schedule an appointment as soon as possible for a visit on 01/27/2018.   Specialty:  Orthopedic Surgery Contact information: [address] [place] [address] [phone]           Signed: Theresa Duty, PA-C Orthopaedic Surgery 01/15/2018, 5:47 PM

## 2018-01-15 NOTE — Progress Notes (Addendum)
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 Patient taken to bathroom then ambulated 50 feet in hallway. Decreased gait speed with step to antaligic pattern. Patient able to do one step u/p down x2 with husband guarding; minimal cueing needed. Patient did standing therex and needed extensive explanation on rationale and form. Patient tolerated treatment well and reported a slight decrease in pain following therapy. Patient to D/C today.   Follow Up Recommendations  Follow surgeon's recommendation for DC plan and follow-up therapies;Other (comment)     Equipment Recommendations  None recommended by PT    Recommendations for Other Services OT consult     Precautions / Restrictions Precautions Precautions: Fall Restrictions Weight Bearing Restrictions: No Other Position/Activity Restrictions: WBAT    Mobility  Bed Mobility Overal bed mobility: Needs Assistance Bed Mobility: Supine to Sit     Supine to sit: Supervision     General bed mobility comments: cues for sequence and use of L LE to self assist  Transfers Overall transfer level: Needs assistance Equipment used: Rolling walker (2 wheeled) Transfers: Sit to/from Stand Sit to Stand: Min guard         General transfer comment: 25% VC's on safety with turns   Ambulation/Gait Ambulation/Gait assistance: Min guard Gait Distance (Feet): 40 Feet Assistive device: Rolling walker (2 wheeled) Gait Pattern/deviations: Step-to pattern;Decreased step length - right;Decreased step length - left;Shuffle;Trunk flexed Gait velocity: decreased   General Gait Details: 25% VC's on safety with turns with walker    Stairs Stairs: Yes Stairs assistance: Min guard Stair  Management: No rails Number of Stairs: 1 General stair comments: Patient did 1 step up/dn x 2 with husband guarding   Wheelchair Mobility    Modified Rankin (Stroke Patients Only)       Balance                                            Cognition Arousal/Alertness: Awake/alert Behavior During Therapy: WFL for tasks assessed/performed Overall Cognitive Status: Within Functional Limits for tasks assessed                                        Exercises Total Joint Exercises Hip ABduction/ADduction: AROM;Strengthening;Right;Standing;10 reps Marching in Standing: AROM;Strengthening;Right;10 reps;Standing Standing Hip Extension: AROM;Strengthening;Right;10 reps;Standing    General Comments        Pertinent Vitals/Pain Pain Assessment: 0-10 Pain Score: 3  Pain Location: R hip Pain Descriptors / Indicators: Aching;Sore Pain Intervention(s): Monitored during session;Repositioned;Premedicated before session;Ice applied    Home Living                      Prior Function            PT Goals (current goals can now be found in the care plan section) Progress towards PT goals: Progressing toward goals    Frequency    7X/week      PT Plan Current plan remains appropriate    Co-evaluation  AM-PAC PT "6 Clicks" Daily Activity  Outcome Measure  Difficulty turning over in bed (including adjusting bedclothes, sheets and blankets)?: A Little Difficulty moving from lying on back to sitting on the side of the bed? : A Little Difficulty sitting down on and standing up from a chair with arms (e.g., wheelchair, bedside commode, etc,.)?: A Little Help needed moving to and from a bed to chair (including a wheelchair)?: A Little Help needed walking in hospital room?: A Little Help needed climbing 3-5 steps with a railing? : A Lot 6 Click Score: 17    End of Session Equipment Utilized During Treatment: Gait  belt Activity Tolerance: Patient tolerated treatment well Patient left: in chair;with call bell/phone within reach;with family/visitor present Nurse Communication: Mobility status PT Visit Diagnosis: Difficulty in walking, not elsewhere classified (R26.2)     Time: [id number] PT Time Calculation (min) (ACUTE ONLY): 24 min  Charges:  $Gait Training: 8-22 mins $Therapeutic Exercise: 8-22 mins                    G Codes:       Angel Edwards. SPTA 01/15/2018, 2:33 PM  Direct supervision during session  Angel Edwards  PTA [facility]  Acute  Rehab Pager      [phone]

## 2018-01-15 NOTE — Progress Notes (Signed)
Spoke with patient and spouse at bedside. Confirmed plan for HEP. Has RW and 3n1. [phone]

## 2018-01-16 ENCOUNTER — Encounter (HOSPITAL_COMMUNITY): Payer: Self-pay | Admitting: Orthopedic Surgery

## 2018-02-16 DIAGNOSIS — M25551 Pain in right hip: Secondary | ICD-10-CM | POA: Diagnosis not present

## 2018-02-20 DIAGNOSIS — M25551 Pain in right hip: Secondary | ICD-10-CM | POA: Diagnosis not present

## 2018-02-23 DIAGNOSIS — M25551 Pain in right hip: Secondary | ICD-10-CM | POA: Diagnosis not present

## 2018-02-25 DIAGNOSIS — M25551 Pain in right hip: Secondary | ICD-10-CM | POA: Diagnosis not present

## 2018-03-02 DIAGNOSIS — M25551 Pain in right hip: Secondary | ICD-10-CM | POA: Diagnosis not present

## 2018-03-03 DIAGNOSIS — M1611 Unilateral primary osteoarthritis, right hip: Secondary | ICD-10-CM | POA: Diagnosis not present

## 2018-03-04 DIAGNOSIS — M25551 Pain in right hip: Secondary | ICD-10-CM | POA: Diagnosis not present

## 2018-03-13 DIAGNOSIS — M25551 Pain in right hip: Secondary | ICD-10-CM | POA: Diagnosis not present

## 2018-03-16 DIAGNOSIS — M25551 Pain in right hip: Secondary | ICD-10-CM | POA: Diagnosis not present

## 2018-03-19 DIAGNOSIS — M25551 Pain in right hip: Secondary | ICD-10-CM | POA: Diagnosis not present

## 2018-03-23 DIAGNOSIS — M25551 Pain in right hip: Secondary | ICD-10-CM | POA: Diagnosis not present

## 2018-03-26 DIAGNOSIS — M25551 Pain in right hip: Secondary | ICD-10-CM | POA: Diagnosis not present

## 2018-06-26 DIAGNOSIS — M7061 Trochanteric bursitis, right hip: Secondary | ICD-10-CM | POA: Diagnosis not present

## 2023-01-05 ENCOUNTER — Encounter (HOSPITAL_COMMUNITY): Payer: Self-pay

## 2023-01-05 ENCOUNTER — Other Ambulatory Visit: Payer: Self-pay

## 2023-01-05 ENCOUNTER — Encounter: Payer: Self-pay | Admitting: Emergency Medicine

## 2023-01-05 ENCOUNTER — Ambulatory Visit
Admission: EM | Admit: 2023-01-05 | Discharge: 2023-01-05 | Disposition: A | Discharge status: Home / Self Care | Payer: No Typology Code available for payment source | Attending: Internal Medicine | Admitting: Internal Medicine

## 2023-01-05 ENCOUNTER — Emergency Department (HOSPITAL_COMMUNITY)
Admission: EM | Admit: 2023-01-05 | Discharge: 2023-01-05 | Disposition: A | Discharge status: Home / Self Care | Payer: No Typology Code available for payment source | Attending: Emergency Medicine | Admitting: Emergency Medicine

## 2023-01-05 DIAGNOSIS — R7981 Abnormal blood-gas level: Secondary | ICD-10-CM

## 2023-01-05 DIAGNOSIS — W57XXXA Bitten or stung by nonvenomous insect and other nonvenomous arthropods, initial encounter: Secondary | ICD-10-CM | POA: Diagnosis not present

## 2023-01-05 DIAGNOSIS — T7840XA Allergy, unspecified, initial encounter: Secondary | ICD-10-CM | POA: Diagnosis present

## 2023-01-05 DIAGNOSIS — M7989 Other specified soft tissue disorders: Secondary | ICD-10-CM | POA: Diagnosis not present

## 2023-01-05 DIAGNOSIS — R2241 Localized swelling, mass and lump, right lower limb: Secondary | ICD-10-CM

## 2023-01-05 DIAGNOSIS — R079 Chest pain, unspecified: Secondary | ICD-10-CM | POA: Diagnosis not present

## 2023-01-05 LAB — BASIC METABOLIC PANEL
Anion gap: 7 (ref 5–15)
BUN: 17 mg/dL (ref 8–23)
CO2: 23 mmol/L (ref 22–32)
Calcium: 8.5 mg/dL — ABNORMAL LOW (ref 8.9–10.3)
Chloride: 108 mmol/L (ref 98–111)
Creatinine, Ser: 0.62 mg/dL (ref 0.44–1.00)
GFR, Estimated: >60 mL/min (ref >60)
Glucose, Bld: 100 mg/dL — ABNORMAL HIGH (ref 70–99)
Potassium: 3.7 mmol/L (ref 3.5–5.1)
Sodium: 138 mmol/L (ref 135–145)

## 2023-01-05 LAB — CBC WITH DIFFERENTIAL/PLATELET
Abs Immature Granulocytes: 0.03 10*3/uL (ref 0.00–0.07)
Basophils Absolute: 0 10*3/uL (ref 0.0–0.1)
Basophils Relative: 0 %
Eosinophils Absolute: 0.1 10*3/uL (ref 0.0–0.5)
Eosinophils Relative: 1 %
HCT: 40.3 % (ref 36.0–46.0)
Hemoglobin: 13.1 g/dL (ref 12.0–15.0)
Immature Granulocytes: 0 %
Lymphocytes Relative: 25 %
Lymphs Abs: 2 10*3/uL (ref 0.7–4.0)
MCH: 31.2 pg (ref 26.0–34.0)
MCHC: 32.5 g/dL (ref 30.0–36.0)
MCV: 96 fL (ref 80.0–100.0)
Monocytes Absolute: 0.5 10*3/uL (ref 0.1–1.0)
Monocytes Relative: 6 %
Neutro Abs: 5.5 10*3/uL (ref 1.7–7.7)
Neutrophils Relative %: 68 %
Platelets: 282 10*3/uL (ref 150–400)
RBC: 4.2 MIL/uL (ref 3.87–5.11)
RDW: 12.4 % (ref 11.5–15.5)
WBC: 8.1 10*3/uL (ref 4.0–10.5)
nRBC: 0 % (ref 0.0–0.2)

## 2023-01-05 MED ORDER — PREDNISONE 10 MG (21) PO TBPK: ORAL_TABLET | Freq: Every day | ORAL | 0 refills | Status: DC

## 2023-01-05 MED ORDER — METHYLPREDNISOLONE SODIUM SUCC 125 MG IJ SOLR
125.0000 mg | Freq: Once | INTRAMUSCULAR | Status: AC
  Administered 2023-01-05: 125 mg via INTRAVENOUS
  Filled 2023-01-05: qty 2

## 2023-01-05 MED ORDER — FAMOTIDINE IN NACL 20-0.9 MG/50ML-% IV SOLN
20.0000 mg | Freq: Once | INTRAVENOUS | Status: AC
  Administered 2023-01-05: 20 mg via INTRAVENOUS
  Filled 2023-01-05: qty 50

## 2023-01-05 NOTE — Discharge Instructions (Signed)
Please go straight to the emergency department as soon as you leave urgent care for further evaluation and management. 

## 2023-01-05 NOTE — ED Notes (Signed)
Advised to go to the emergency dept for further evaluation

## 2023-01-05 NOTE — Discharge Instructions (Signed)
Your blood work today was reassuring.  You received couple medications in the emergency room including shot of steroids.  Your symptoms improved.  We prescribed you an extended course of prednisone.  Since he had the first dose today in the emergency department you can start this tomorrow.  For any worsening symptoms return to the emergency room otherwise follow-up with your primary care provider in the next 2 to 3 days.  You can continue taking Claritin or other nondrowsy antihistamine.

## 2023-01-05 NOTE — ED Provider Notes (Signed)
[facility]    CSN: [id number] Arrival date & time: 01/05/23  1221      History   Chief Complaint Chief Complaint  Patient presents with   Allergic Reaction    HPI Angel Edwards is a 67 y.o. female.   Patient presents with bilateral hand swelling and itchiness and similar symptoms to the plantar portion of the right foot.  Reports that the symptoms started last night.  Denies any feelings of throat closing or shortness of breath.  Patient denies any changes to the environment that could have caused any type of allergic reaction.  She reports that she did start using some new type of soil in her terrarium and is not sure if it is related.  Has taken Benadryl and Claritin with no improvement in symptoms.  She also mentions that she was bitten by a tick approximately 2 weeks ago but is not sure if this is related.  Reports that she is not sure if the tick was fully removed but it was present on the right upper portion of her back.  She is also not sure how long it was attached.  States that the night after the tick was removed, she started having some mid chest pain that was severe.  It lasted about 1 to 2 hours and resolved.  She does have some associated belching so took some Pepto-Bismol with minimal improvement.  She states that she almost went to the emergency department but it resolved.  Denies headache, dizziness, blurred vision, nausea, vomiting, fever.  She does report some diarrhea that was present at the same time of chest pain which is now resolved. Has eaten some red meat after the tick was attached as well.    Allergic Reaction   Past Medical History:  Diagnosis Date   History of gestational diabetes    Hyperlipidemia    Migraines    OA (osteoarthritis)    right hip    Patient Active Problem List   Diagnosis Date Noted   OA (osteoarthritis) of hip 01/14/2018    Past Surgical History:  Procedure Laterality Date   CESAREAN SECTION  1993;  1997   IR  ERCP  2003  approx.   TOTAL HIP ARTHROPLASTY Left 01-30-2010  dr Charlann Boxer  [facility]   TOTAL HIP ARTHROPLASTY Right 01/14/2018   Procedure: RIGHT TOTAL HIP ARTHROPLASTY ANTERIOR APPROACH;  Surgeon: Ollen Gross, MD;  Location: [facility];  Service: Orthopedics;  Laterality: Right;    OB History   No obstetric history on file.      Home Medications    Prior to Admission medications   Medication Sig Start Date End Date Taking? Authorizing Provider  HYDROcodone-acetaminophen (NORCO/VICODIN) 5-325 MG tablet Take 1-2 tablets by mouth every 6 (six) hours as needed for moderate pain or severe pain. 01/15/18   Edmisten, Lyn Hollingshead, PA  methocarbamol (ROBAXIN) 500 MG tablet Take 1 tablet (500 mg total) by mouth every 6 (six) hours as needed for muscle spasms. 01/15/18   Edmisten, Lyn Hollingshead, PA  traMADol (ULTRAM) 50 MG tablet Take 1-2 tablets (50-100 mg total) by mouth every 6 (six) hours as needed for moderate pain (not relieved by hydrocodone). 01/15/18   Edmisten, Lyn Hollingshead, PA    Family History History reviewed. No pertinent family history.  Social History Social History   Tobacco Use   Smoking status: Former    Packs/day: 1.00    Years: 15.00    Additional pack years: 0.00    Total  pack years: 15.00    Types: Cigarettes    Quit date: 01/06/2007    Years since quitting: 16.0   Smokeless tobacco: Never  Vaping Use   Vaping Use: Never used  Substance Use Topics   Alcohol use: Never   Drug use: Never     Allergies   Demerol [meperidine hcl] and Lidocaine   Review of Systems Review of Systems Per HPI  Physical Exam Triage Vital Signs ED Triage Vitals [01/05/23 1231]  Enc Vitals Group     BP 120/84     Pulse Rate 90     Resp 16     Temp 98.7 F (37.1 C)     Temp Source Oral     SpO2 94 %     Weight 193 lb (87.5 kg)     Height 5\' 4"  (1.626 m)     Head Circumference      Peak Flow      Pain Score 6     Pain Loc      Pain Edu?      Excl. in GC?    No data found.  Updated  Vital Signs BP 120/84   Pulse 90   Temp 98.7 F (37.1 C) (Oral)   Resp 16   Ht 5\' 4"  (1.626 m)   Wt 193 lb (87.5 kg)   SpO2 90%   BMI 33.13 kg/m   Visual Acuity Right Eye Distance:   Left Eye Distance:   Bilateral Distance:    Right Eye Near:   Left Eye Near:    Bilateral Near:     Physical Exam Constitutional:      General: She is not in acute distress.    Appearance: Normal appearance. She is not toxic-appearing or diaphoretic.  HENT:     Head: Normocephalic and atraumatic.     Comments: No obvious signs of throat swelling or tongue swelling noted. Eyes:     Extraocular Movements: Extraocular movements intact.     Conjunctiva/sclera: Conjunctivae normal.  Cardiovascular:     Rate and Rhythm: Normal rate and regular rhythm.     Pulses: Normal pulses.     Heart sounds: Normal heart sounds.  Pulmonary:     Effort: Pulmonary effort is normal. No respiratory distress.     Breath sounds: Normal breath sounds. No stridor. No wheezing, rhonchi or rales.  Skin:    Comments: Patient has swelling with no erythema present throughout the digits of bilateral hands.  Patient has full range of motion of hands and is neurovascularly intact.  Neurological:     General: No focal deficit present.     Mental Status: She is alert and oriented to person, place, and time. Mental status is at baseline.  Psychiatric:        Mood and Affect: Mood normal.        Behavior: Behavior normal.        Thought Content: Thought content normal.        Judgment: Judgment normal.      UC Treatments / Results  Labs (all labs ordered are listed, but only abnormal results are displayed) Labs Reviewed - No data to display  EKG   Radiology No results found.  Procedures Procedures (including critical care time)  Medications Ordered in UC Medications - No data to display  Initial Impression / Assessment and Plan / UC Course  I have reviewed the triage vital signs and the nursing  notes.  Pertinent labs & imaging results that  were available during my care of the patient were reviewed by me and considered in my medical decision making (see chart for details).     I am suspicious that patient is having some sort of allergic versus inflammatory reaction.  Patient did have a recent tick attachment so do have to consider that this could be related.  But, I am most concerned about patient's oxygen ranging from 90 to 92% during physical exam.  Although, I am not concerned for anaphylaxis given patient is not having any shortness of breath, no adventitious lung sounds, and no feelings of throat closing so do not think that epinephrine administration is necessary.  Given oxygen saturation and unsure exact etiology of patient's symptoms, recommended that she go to the emergency department to have a full and extensive evaluation.  She was agreeable with this plan but declined EMS transport.  EKG completed that was normal sinus rhythm.  Patient was agreeable to letting her family member transport her to the ER and left via self transport. Final Clinical Impressions(s) / UC Diagnoses   Final diagnoses:  Bilateral hand swelling  Localized swelling of right foot  Chest pain, unspecified type  Tick bite, unspecified site, initial encounter  Low oxygen saturation     Discharge Instructions      Please go straight to the emergency department as soon as you leave urgent care for further evaluation and management.    ED Prescriptions   None    PDMP not reviewed this encounter.   Gustavus Bryant, [place] 01/05/23 1310

## 2023-01-05 NOTE — ED Provider Notes (Signed)
[facility] AT [facility] Provider Note   CSN: [id number] Arrival date & time: 01/05/23  1323     History  Chief Complaint  Patient presents with   Allergic Reaction         Angel Edwards is a 67 y.o. female.  67 year old female presents today for evaluation of bilateral hand swelling and itchiness.  This started yesterday.  She had a similar episode about 2 weeks ago.  She states each of these episodes occurred around the time she was working with coco coir.  2 weeks ago was the first time she used this substance.  She states she had similar symptoms and then with but they all resolved within 24 hours.  She noticed her symptoms came on last night after going to bed.  She states she took off her rings because she felt her fingers may swell.  She took a Benadryl around 4:30 AM.  She took a Claritin around 11 AM.  Has not had any improvement in symptoms.  Denies any chest pain, shortness of breath, throat swelling.  Did notice that her eyelids swelled up a little.  No vision change.  Denies any rash.  Had similar symptoms to the plantar portion of her right foot.  This has resolved.  She was seen at urgent care and referred to the emergency department.  She also had an SpO2 of 92% at urgent care which they were concerned about.  She remains without shortness of breath.  Here her SpO2 has been 97% and she has remained on room air.  The history is provided by the patient. No language interpreter was used.        Home Medications Prior to Admission medications   Medication Sig Start Date End Date Taking? Authorizing Provider  HYDROcodone-acetaminophen (NORCO/VICODIN) 5-325 MG tablet Take 1-2 tablets by mouth every 6 (six) hours as needed for moderate pain or severe pain. 01/15/18   Edmisten, Lyn Hollingshead, PA  methocarbamol (ROBAXIN) 500 MG tablet Take 1 tablet (500 mg total) by mouth every 6 (six) hours as needed for muscle spasms. 01/15/18   Edmisten, Lyn Hollingshead,  PA  traMADol (ULTRAM) 50 MG tablet Take 1-2 tablets (50-100 mg total) by mouth every 6 (six) hours as needed for moderate pain (not relieved by hydrocodone). 01/15/18   Edmisten, Lyn Hollingshead, PA      Allergies    Demerol [meperidine hcl] and Lidocaine    Review of Systems   Review of Systems  Constitutional:  Negative for fever.  Respiratory:  Negative for cough and shortness of breath.   Cardiovascular:  Negative for chest pain.  Gastrointestinal:  Negative for abdominal pain.  Musculoskeletal:  Positive for joint swelling.  Neurological:  Negative for light-headedness.  All other systems reviewed and are negative.   Physical Exam Updated Vital Signs BP 132/83 (BP Location: Right Arm)   Pulse 90   Temp 98.2 F (36.8 C) (Oral)   Resp 16   SpO2 97%  Physical Exam Vitals and nursing note reviewed.  Constitutional:      General: She is not in acute distress.    Appearance: Normal appearance. She is not ill-appearing.  HENT:     Head: Normocephalic and atraumatic.     Nose: Nose normal.  Eyes:     General: No scleral icterus.    Extraocular Movements: Extraocular movements intact.     Conjunctiva/sclera: Conjunctivae normal.  Cardiovascular:     Rate and Rhythm: Normal rate and  regular rhythm.     Heart sounds: Normal heart sounds.  Pulmonary:     Effort: Pulmonary effort is normal. No respiratory distress.     Breath sounds: Normal breath sounds. No wheezing or rales.  Abdominal:     General: There is no distension.     Tenderness: There is no abdominal tenderness.  Musculoskeletal:        General: Normal range of motion.     Cervical back: Normal range of motion.     Comments: Swelling noted to bilateral hands.  Mostly to the dorsal aspect and over the PIP.  Has good range of motion otherwise.  Neurovascularly intact.  Skin:    General: Skin is warm and dry.  Neurological:     General: No focal deficit present.     Mental Status: She is alert. Mental status is at  baseline.    ED Results / Procedures / Treatments   Labs (all labs ordered are listed, but only abnormal results are displayed) Labs Reviewed  BASIC METABOLIC PANEL - Abnormal; Notable for the following components:      Result Value   Glucose, Bld 100 (*)    Calcium 8.5 (*)    All other components within normal limits  CBC WITH DIFFERENTIAL/PLATELET    EKG None  Radiology No results found.  Procedures Procedures    Medications Ordered in ED Medications  methylPREDNISolone sodium succinate (SOLU-MEDROL) 125 mg/2 mL injection 125 mg (125 mg Intravenous Given 01/05/23 1446)  famotidine (PEPCID) IVPB 20 mg premix (0 mg Intravenous Stopped 01/05/23 1518)    ED Course/ Medical Decision Making/ A&P                             Medical Decision Making Amount and/or Complexity of Data Reviewed Labs: ordered.  Risk Prescription drug management.   67 year old female presents today for concern of swelling to bilateral hands.  Started last night after she interacted with coco coir.  Had similar reaction the last time she handled coco coir.  Has taken Benadryl and Claritin today without significant improvement.  Was seen at urgent care and referred to the emergency department.  No signs or symptoms consistent with anaphylaxis.  She is without rash, nausea, or sensation of throat closing or throat swelling.  Will give steroids and obtain basic blood work.  CBC unremarkable.  BMP unremarkable.  Steroids and Pepcid given with improvement in symptoms.  Will give course of steroids.  Patient is in agreement with this plan.  Strict return precautions given.  She will follow-up with PCP.   Final Clinical Impression(s) / ED Diagnoses Final diagnoses:  Allergic reaction, initial encounter    Rx / DC Orders ED Discharge Orders          Ordered    predniSONE (STERAPRED UNI-PAK 21 TAB) 10 MG (21) TBPK tablet  Daily        01/05/23 1526              Marita [place], PA-C 01/05/23  1852    Rolan Bucco, MD 01/05/23 [phone]

## 2023-01-05 NOTE — ED Triage Notes (Signed)
Pt c/o swelling to both hands and right foot starting last night.  Denies itching or swelling to throat.  Does feel like eyelids are starting to get puffy.  Did have tick on her 2 weeks ago and mentioned eating red meat last night and day before.  Also has used new soil when gardening.  Took 50 mg benadryl at 4 am and fell asleep but reports swelling getting worse.  Unlabored. VSS at this time.

## 2023-01-05 NOTE — ED Triage Notes (Signed)
Pt arrived via POV, c/o swelling, pain, and itching to bilateral hands and right foot. States recently bit by a tick and having ongoing issues since. Was seen at [facility] and told to come to ed for concern of systemic reaction. Benadryl at 0430am, and claratin approx 11am

## 2023-01-16 ENCOUNTER — Other Ambulatory Visit: Payer: Self-pay

## 2023-01-16 ENCOUNTER — Encounter (HOSPITAL_COMMUNITY): Payer: Self-pay | Admitting: Emergency Medicine

## 2023-01-16 ENCOUNTER — Emergency Department (HOSPITAL_COMMUNITY)
Admission: EM | Admit: 2023-01-16 | Discharge: 2023-01-16 | Disposition: A | Discharge status: Home / Self Care | Payer: No Typology Code available for payment source | Attending: Emergency Medicine | Admitting: Emergency Medicine

## 2023-01-16 DIAGNOSIS — T7840XA Allergy, unspecified, initial encounter: Secondary | ICD-10-CM | POA: Diagnosis present

## 2023-01-16 DIAGNOSIS — T782XXA Anaphylactic shock, unspecified, initial encounter: Secondary | ICD-10-CM | POA: Diagnosis not present

## 2023-01-16 LAB — BASIC METABOLIC PANEL
Anion gap: 9 (ref 5–15)
BUN: 12 mg/dL (ref 8–23)
CO2: 24 mmol/L (ref 22–32)
Calcium: 8.7 mg/dL — ABNORMAL LOW (ref 8.9–10.3)
Chloride: 103 mmol/L (ref 98–111)
Creatinine, Ser: 0.68 mg/dL (ref 0.44–1.00)
GFR, Estimated: >60 mL/min (ref >60)
Glucose, Bld: 103 mg/dL — ABNORMAL HIGH (ref 70–99)
Potassium: 3.9 mmol/L (ref 3.5–5.1)
Sodium: 136 mmol/L (ref 135–145)

## 2023-01-16 LAB — CBC WITH DIFFERENTIAL/PLATELET
Abs Immature Granulocytes: 0.02 10*3/uL (ref 0.00–0.07)
Basophils Absolute: 0.1 10*3/uL (ref 0.0–0.1)
Basophils Relative: 1 %
Eosinophils Absolute: 0.1 10*3/uL (ref 0.0–0.5)
Eosinophils Relative: 1 %
HCT: 43.9 % (ref 36.0–46.0)
Hemoglobin: 14.1 g/dL (ref 12.0–15.0)
Immature Granulocytes: 0 %
Lymphocytes Relative: 37 %
Lymphs Abs: 3.2 10*3/uL (ref 0.7–4.0)
MCH: 31.3 pg (ref 26.0–34.0)
MCHC: 32.1 g/dL (ref 30.0–36.0)
MCV: 97.6 fL (ref 80.0–100.0)
Monocytes Absolute: 0.6 10*3/uL (ref 0.1–1.0)
Monocytes Relative: 7 %
Neutro Abs: 4.6 10*3/uL (ref 1.7–7.7)
Neutrophils Relative %: 54 %
Platelets: 314 10*3/uL (ref 150–400)
RBC: 4.5 MIL/uL (ref 3.87–5.11)
RDW: 12.6 % (ref 11.5–15.5)
WBC: 8.6 10*3/uL (ref 4.0–10.5)
nRBC: 0 % (ref 0.0–0.2)

## 2023-01-16 MED ORDER — METHYLPREDNISOLONE SODIUM SUCC 125 MG IJ SOLR
125.0000 mg | Freq: Once | INTRAMUSCULAR | Status: AC
  Administered 2023-01-16: 125 mg via INTRAVENOUS
  Filled 2023-01-16: qty 2

## 2023-01-16 MED ORDER — EPINEPHRINE 0.3 MG/0.3ML IJ SOAJ
0.3000 mg | Freq: Once | INTRAMUSCULAR | Status: AC
  Administered 2023-01-16: 0.3 mg via INTRAMUSCULAR
  Filled 2023-01-16: qty 0.3

## 2023-01-16 MED ORDER — DIPHENHYDRAMINE HCL 50 MG/ML IJ SOLN
25.0000 mg | Freq: Once | INTRAMUSCULAR | Status: AC
  Administered 2023-01-16: 25 mg via INTRAVENOUS
  Filled 2023-01-16: qty 1

## 2023-01-16 NOTE — ED Notes (Signed)
Patient is resting comfortably. 

## 2023-01-16 NOTE — ED Provider Notes (Signed)
[place] EMERGENCY DEPARTMENT AT [facility] Provider Note   CSN: [id number] Arrival date & time: 01/16/23  1336    History  Chief Complaint  Patient presents with   Allergic Reaction    Angel Edwards is a 67 y.o. female here for evaluation of allergic reaction.  Recently found out today she was positive for alpha gal.  She has had recurrent episodes of facial swelling (tongue and lips) as well as hand and feet swelling was seen here in the emergency department for something similar approximately 2 weeks ago.  Was seen by PCP after she had some tick bites prior to her allergic reactions.  Tested positive for alpha gal, called today by pcp.  She had a ready eaten something with gelatin in It which she now knows she is not supposed to have.  She woke up this morning with tongue and neck swelling.  Does not taking medications at home, specifically ACE inhibitor's.  No rashes, nausea, vomiting, shortness of breath. Feels like her voice has changed. No sensation of throat closing. No facial erythema, dental pain, induration, CP, urticaria.  Like her swelling has improved however she still has a change in voice and her PCP sent her here due to known alpha gal currently.  Took hydroxyzine as well as Pepcid PTA.  She has prescription for EpiPen however did not use it, she is not sure when exactly to use the pen  HPI     Home Medications Prior to Admission medications   Medication Sig Start Date End Date Taking? Authorizing Provider  HYDROcodone-acetaminophen (NORCO/VICODIN) 5-325 MG tablet Take 1-2 tablets by mouth every 6 (six) hours as needed for moderate pain or severe pain. 01/15/18   Edmisten, Lyn Hollingshead, PA  methocarbamol (ROBAXIN) 500 MG tablet Take 1 tablet (500 mg total) by mouth every 6 (six) hours as needed for muscle spasms. 01/15/18   Edmisten, Kristie L, PA  predniSONE (STERAPRED UNI-PAK 21 TAB) 10 MG (21) TBPK tablet Take by mouth daily. Take 6 tabs by mouth daily  for 2  days, then 5 tabs for 2 days, then 4 tabs for 2 days, then 3 tabs for 2 days, 2 tabs for 2 days, then 1 tab by mouth daily for 2 days 01/05/23   Marita Kansas, PA-C  traMADol (ULTRAM) 50 MG tablet Take 1-2 tablets (50-100 mg total) by mouth every 6 (six) hours as needed for moderate pain (not relieved by hydrocodone). 01/15/18   Edmisten, Lyn Hollingshead, PA      Allergies    Demerol [meperidine hcl] and Lidocaine    Review of Systems   Review of Systems  Constitutional: Negative.   HENT:  Positive for facial swelling and voice change. Negative for congestion, drooling, ear discharge, ear pain, sore throat, tinnitus and trouble swallowing.   Respiratory: Negative.    Cardiovascular: Negative.   Gastrointestinal: Negative.   Genitourinary: Negative.   Musculoskeletal: Negative.   Skin: Negative.   Neurological: Negative.   All other systems reviewed and are negative.   Physical Exam Updated Vital Signs BP 131/78   Pulse 89   Temp 99 F (37.2 C) (Oral)   Resp 18   Ht 5\' 4"  (1.626 m)   Wt 87.5 kg   SpO2 95%   BMI 33.11 kg/m  Physical Exam Vitals and nursing note reviewed.  Constitutional:      General: She is not in acute distress.    Appearance: She is well-developed. She is not ill-appearing, toxic-appearing or  diaphoretic.  HENT:     Head: Normocephalic and atraumatic.     Jaw: There is normal jaw occlusion.     Comments: No Drooling, dysphagia or trismus    Nose: Nose normal.     Mouth/Throat:     Mouth: Mucous membranes are moist.     Dentition: Normal dentition. No dental tenderness, gingival swelling, dental caries or dental abscesses.     Tongue: No lesions. Tongue does not deviate from midline.     Palate: No mass and lesions.     Pharynx: Oropharynx is clear. Uvula midline. No uvula swelling.     Tonsils: No tonsillar exudate or tonsillar abscesses. 0 on the right. 0 on the left.     Comments: Possible mild tongue swelling, able to visualize posterior oropharynx without  difficulty.  No pooling of secretions.  No gingival erythema or obvious abscess.  No evidence of PTA or RPA, uvula midline. Eyes:     Pupils: Pupils are equal, round, and reactive to light.  Neck:     Vascular: No carotid bruit.     Trachea: Trachea normal.     Comments: No Stridor, possible hoarse voice Cardiovascular:     Rate and Rhythm: Normal rate.     Pulses: Normal pulses.          Radial pulses are 2+ on the right side and 2+ on the left side.     Heart sounds: Normal heart sounds.  Pulmonary:     Effort: Pulmonary effort is normal. No respiratory distress.     Breath sounds: Normal breath sounds and air entry.     Comments: Clear, speaks without difficulty Abdominal:     General: Bowel sounds are normal. There is no distension.     Palpations: Abdomen is soft.     Tenderness: There is no abdominal tenderness.  Musculoskeletal:        General: No swelling, tenderness, deformity or signs of injury. Normal range of motion.     Cervical back: Full passive range of motion without pain, normal range of motion and neck supple. No tenderness.     Right lower leg: No edema.     Left lower leg: No edema.  Skin:    General: Skin is warm and dry.     Capillary Refill: Capillary refill takes less than 2 seconds.     Comments: No rashes, lesions, urticaria  Neurological:     General: No focal deficit present.     Mental Status: She is alert and oriented to person, place, and time.  Psychiatric:        Mood and Affect: Mood normal.    ED Results / Procedures / Treatments   Labs (all labs ordered are listed, but only abnormal results are displayed) Labs Reviewed  BASIC METABOLIC PANEL - Abnormal; Notable for the following components:      Result Value   Glucose, Bld 103 (*)    Calcium 8.7 (*)    All other components within normal limits  CBC WITH DIFFERENTIAL/PLATELET    EKG None  Radiology No results found.  Procedures .Critical Care  Performed by: Linwood Dibbles, PA-C Authorized by: Linwood Dibbles, PA-C   Critical care provider statement:    Critical care time (minutes):  35   Critical care was necessary to treat or prevent imminent or life-threatening deterioration of the following conditions:  Toxidrome   Critical care was time spent personally by me on the following activities:  Development  of treatment plan with patient or surrogate, discussions with consultants, evaluation of patient's response to treatment, examination of patient, ordering and review of laboratory studies, ordering and review of radiographic studies, ordering and performing treatments and interventions, pulse oximetry, re-evaluation of patient's condition and review of old charts     Medications Ordered in ED Medications  diphenhydrAMINE (BENADRYL) injection 25 mg (25 mg Intravenous Given 01/16/23 1510)  methylPREDNISolone sodium succinate (SOLU-MEDROL) 125 mg/2 mL injection 125 mg (125 mg Intravenous Given 01/16/23 1511)  EPINEPHrine (EPI-PEN) injection 0.3 mg (0.3 mg Intramuscular Given 01/16/23 1516)   ED Course/ Medical Decision Making/ A&P Clinical Course as of 01/16/23 1842  Thu Jan 16, 2023  1626 Reassessed.  Sounds and feels significantly improved.  Will continue to monitor [BH]    Clinical Course User Index [BH] Yenny Kosa A, PA-C   67 year old here for evaluation of allergic reaction.  Recently found out today she was positive for alpha gal after being bit by ticks a few weeks ago.  Has had some intermittent allergic reactions over the last few weeks.  She has not any ACE inhibitors.  No new lotions, perfumes or detergents.  Did eat some of the gelatin earlier today woke up this morning with tongue swelling and feels that he is going to the left side of her neck.  She has no neck rigidity.  No facial erythema, induration or fluctuation to suggest infectious process.  No evidence of PTA or RPA on exam.  She is possible minimal tongue swelling however I am able  to visualize her posterior oropharynx.  She has no pooling of secretions.  No obvious swelling to neck.  Full range of motion without difficulty.  She has no stridor.  Husband is states she has a change in her voice.  No urticaria, nausea or vomiting.  Given possible change in voice, mild tongue swelling will give EpiPen.  Will check basic labs as well  Labs personally viewed and interpreted:  CBC without significant findings Metabolic panel glucose 103  Patient reassessed.  Voice now back to normal.  She denies any tongue swelling.  Hemodynamically stable.  Will continue to monitor.  Patient reassessed again.  Clinically stable.  No current evidence of allergic reaction.  We discussed strict return precautions.  Has follow-up with PCP to establish care with allergist.  She already has EpiPen prescription.  Encouraged antihistamine use, return for new or worsening symptoms.  At this time low suspicion for acute respiratory compromise, no active angioedema, infectious process, stridor, shock  D/c Home in stable condition  The patient has been appropriately medically screened and/or stabilized in the ED. I have low suspicion for any other emergent medical condition which would require further screening, evaluation or treatment in the ED or require inpatient management.  Patient is hemodynamically stable and in no acute distress.  Patient able to ambulate in department prior to ED.  Evaluation does not show acute pathology that would require ongoing or additional emergent interventions while in the emergency department or further inpatient treatment.  I have discussed the diagnosis with the patient and answered all questions.  Pain is been managed while in the emergency department and patient has no further complaints prior to discharge.  Patient is comfortable with plan discussed in room and is stable for discharge at this time.  I have discussed strict return precautions for returning to the  emergency department.  Patient was encouraged to follow-up with PCP/specialist refer to at discharge.  Medical Decision Making Amount and/or Complexity of Data Reviewed Independent Historian: spouse External Data Reviewed: labs, radiology and notes. Labs: ordered. Decision-making details documented in ED Course. Radiology: ordered and independent interpretation performed. Decision-making details documented in ED Course.  Risk OTC drugs. Prescription drug management. Parenteral controlled substances. Decision regarding hospitalization. Diagnosis or treatment significantly limited by social determinants of health.          Final Clinical Impression(s) / ED Diagnoses Final diagnoses:  Anaphylaxis, initial encounter    Rx / DC Orders ED Discharge Orders     None         Milderd Manocchio A, PA-C 01/16/23 1842    Pricilla Loveless, MD 01/17/23 [phone]

## 2023-01-16 NOTE — Discharge Instructions (Addendum)
It was a pleasure taking care of you in the emergency department.  Discussed in the room if you develop a similar reaction please use the EpiPen, take 25 mg of Benadryl as well as 40 mg of Pepcid and be seen in the emergency department  Let your primary care provider know you were seen today   Return for new or worsening symptoms.

## 2023-01-16 NOTE — ED Triage Notes (Signed)
Pt reports allergic reaction starting this morning. States she took hydroxine and Pepcid today. Endorses tongue swelling when she woke up that has improved some throughout the day. States she was told not to have any red meat. No tongue swelling noticed in triage.

## 2023-01-23 ENCOUNTER — Other Ambulatory Visit: Payer: Self-pay | Admitting: Physician Assistant

## 2023-01-23 DIAGNOSIS — Z1231 Encounter for screening mammogram for malignant neoplasm of breast: Secondary | ICD-10-CM

## 2023-02-08 LAB — COLOGUARD: COLOGUARD: NEGATIVE

## 2023-02-12 ENCOUNTER — Ambulatory Visit
Admission: RE | Admit: 2023-02-12 | Discharge: 2023-02-12 | Disposition: A | Discharge status: Home / Self Care | Payer: No Typology Code available for payment source | Source: Ambulatory Visit | Attending: Physician Assistant | Admitting: Physician Assistant

## 2023-02-12 DIAGNOSIS — Z1231 Encounter for screening mammogram for malignant neoplasm of breast: Secondary | ICD-10-CM

## 2023-02-17 ENCOUNTER — Other Ambulatory Visit: Payer: Self-pay | Admitting: Physician Assistant

## 2023-02-17 DIAGNOSIS — R928 Other abnormal and inconclusive findings on diagnostic imaging of breast: Secondary | ICD-10-CM

## 2023-03-05 ENCOUNTER — Ambulatory Visit
Admission: RE | Admit: 2023-03-05 | Discharge: 2023-03-05 | Disposition: A | Discharge status: Home / Self Care | Payer: No Typology Code available for payment source | Source: Ambulatory Visit | Attending: Physician Assistant | Admitting: Physician Assistant

## 2023-03-05 DIAGNOSIS — R928 Other abnormal and inconclusive findings on diagnostic imaging of breast: Secondary | ICD-10-CM

## 2023-03-11 ENCOUNTER — Encounter: Payer: Self-pay | Admitting: Allergy & Immunology

## 2023-03-11 ENCOUNTER — Ambulatory Visit (INDEPENDENT_AMBULATORY_CARE_PROVIDER_SITE_OTHER): Payer: No Typology Code available for payment source | Admitting: Allergy & Immunology

## 2023-03-11 ENCOUNTER — Other Ambulatory Visit: Payer: Self-pay

## 2023-03-11 VITALS — BP 128/84 | HR 79 | Temp 98.7°F | Resp 18 | Ht 64.17 in | Wt 196.9 lb

## 2023-03-11 DIAGNOSIS — T7800XD Anaphylactic reaction due to unspecified food, subsequent encounter: Secondary | ICD-10-CM | POA: Diagnosis not present

## 2023-03-11 DIAGNOSIS — R220 Localized swelling, mass and lump, head: Secondary | ICD-10-CM | POA: Diagnosis not present

## 2023-03-11 DIAGNOSIS — J310 Chronic rhinitis: Secondary | ICD-10-CM

## 2023-03-11 DIAGNOSIS — R221 Localized swelling, mass and lump, neck: Secondary | ICD-10-CM | POA: Diagnosis not present

## 2023-03-11 NOTE — Patient Instructions (Addendum)
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 - I would check out [email or web address] to check on all of your medications. - This will even get down to whether the magnesium stearate is plant derived or animal derived.  - I emailed you some articles on alpha gal syndrome (to [email or web address]).  - Strongly consider getting onto Xolair for long term control. - We are going to get levels to look at your milk. - We are going to get a total IgE level in case we decide to start Xolair.  - You can use Ripple milk (green pea based milk) since this has more protein.  - Testing to the entire food panel was negative aside from mustard (this was only slightly positive). - Copy of testing results provided. - I would continue to avoid red meats and cow's milk for now.   2. Chronic rhinitis - We did not do testing since your symptoms are so well controlled. - We can look into this in the future.   3. Return in about 3 months (around 06/10/2023). You can have the follow up appointment with Dr. Dellis Anes or a Nurse Practicioner (our Nurse Practitioners are excellent and always have Physician oversight!).    Please inform us of any Emergency Department visits, hospitalizations, or changes in symptoms. Call us before going to the ED for breathing or allergy symptoms since we might be able to fit you in for a sick visit. Feel free to contact us anytime with any questions, problems, or concerns.  It was a pleasure to meet you today!  Websites that have reliable patient information: 1. American Academy of Asthma, Allergy, and Immunology: www.aaaai.org 2. Food Allergy Research and Education (FARE): foodallergy.org 3. Mothers of Asthmatics: http://www.asthmacommunitynetwork.org 4. American College of Allergy, Asthma, and Immunology: www.acaai.org   COVID-19 Vaccine Information can be found at: PodExchange.nl For  questions related to vaccine distribution or appointments, please email [email or web address] or call [phone].   We realize that you might be concerned about having an allergic reaction to the COVID19 vaccines. To help with that concern, WE ARE OFFERING THE COVID19 VACCINES IN OUR OFFICE! Ask the front desk for dates!     "Like" [place] on Facebook and Instagram for our latest updates!      A healthy democracy works best when [employer] participate! Make sure you are registered to vote! If you have moved or changed any of your contact information, you will need to get this updated before voting! Scan the QR codes below to learn more!       Alpha-gal and Red Meat Allergy   Overview An allergy to "alpha-gal" refers to having a severe and potentially life-threatening allergy to a carbohydrate molecule called galactose-alpha-1,3-galactose that is found in most mammalian or "red meat". Unlike other food allergies which typically occur within minutes of ingestion, symptoms from eating red meat such as pork, lamb or beef may be delayed, occurring 3-8 hours after eating. Most food allergies are directed against a protein molecule, but alpha-gal is unusual because it is a carbohydrate, and a delay in its absorption may explain the delay in symptoms.  What are the symptoms of an alpha-gal allergy? As with other food allergies, signs or symptoms of an allergy to alpha-gal may include: Hives and itching  Swelling of your lips, face or eyelids  Shortness of breath, cough or wheezing  Abdominal pain, nausea, diarrhea or vomiting The most severe reaction, anaphylaxis, can present as a combination of several of these  symptoms, may include low blood pressure, and is potentially fatal.  Because these symptoms are delayed, you may only wake up with them in the middle of the night after an evening meal.  How is an alpha-gal allergy diagnosed? Diagnosis of this allergy starts with your allergist taking  an appropriate history and physical examination. Because the onset is usually quite delayed, it can be hard to associate the symptoms with eating red meat many hours previously. Triggers include any red meat - including beef, pork, lamb or even horse products. It may occur after eating hotdogs and hamburgers. In very rare cases the reaction may extend to milk or dairy proteins and gelatin.  Your allergist may recommend testing that includes skin tests to the relevant animal proteins and blood tests which measure the levels of a specific immunoglobulin E (IgE) antibody, to mammalian meats. An investigational blood test, IgE against alpha-gal itself, may also aid in the diagnosis.  How is an alpha-gal allergy treated? Immediate symptoms such as hives or shortness of breath are treated the same as any other food allergy - in an urgent care setting with anti-histamines, epinephrine and other medications. Prevention long-term involves avoidance of all red meat in sensitized individuals. You may be advised to carry an epinephrine auto-injector, to be used in case of subsequent accidental exposures and reaction. These measures do not necessarily mean switching to a full vegetarian diet, since poultry and fish can be consumed and do not cause similar reactions. As with other food allergies, there is the possibility that over time the sensitivity diminishes - although these changes may take many years to become apparent.  How do you become allergic to alpha-gal? Alpha-gal is a molecule carried in the saliva of the [place] tick and other potential arthropods typically after feeding on mammalian blood. People that are bitten by the tick, especially those that are bitten repeatedly, are at risk of becoming sensitized and producing the IgE necessary to then cause allergic reactions. Interestingly, allergic reactions may occur to red meat, to subsequent tick bites, and even to medications that contain alpha-gal.  Cetuximab is a cancer medication that contains alpha-gal, and people who have had allergic reactions to this medication (these are typically immediate reactions, because it is infused intravenously) have a higher risk for red meat allergy and are likely to have been bitten by ticks in the past. As might be expected, the incidence of tick bites is much higher in the [place] the traditional habitat for the tick. However, cases are now increasingly reported in the [place] and [place] states. And it is a phenomenon that has been observed worldwide, with different ticks responsible for similar cases of red meat allergy in many other countries such as [place], [place] and [place].  The discovery of this peculiar allergy has allowed researchers to correlate tick bites with many cases of anaphylaxis that would previously have been classified as 'idiopathic', or of unknown cause. Also, while it was originally thought that the [employer] tick had to feast on mammalian blood in order to carry the alpha-gal molecule, more recent research has shown that it may carry this molecule and be capable of sensitizing humans independently.  How do you prevent an alpha-gal allergy? Because this allergy is predominantly tick born, you are more likely at risk if you often go outdoors in wooded areas for activities such as hiking, fishing or hunting. The key strategy is to prevent tick bites. This may include wearing long sleeved  shirts or pants, using appropriate insect repellants, and surveying for ticks after spending time outdoors. Any observed ticks should be removed carefully by cleaning the site with rubbing alcohol, then using tweezers to pull the tick's head up carefully from the skin using steady pressure. Clean your hands and the site one more time and make sure not to crush the tick between your fingers.        Food Adult Perc - 03/11/23 1400     Time Antigen Placed 1430    Allergen  Manufacturer Waynette Buttery    Location Back    Number of allergen test 69     Control-buffer 50% Glycerol Negative    Control-Histamine 3+    1. Peanut Negative    2. Soybean Negative    3. Wheat Negative    4. Sesame Negative    5. Milk, Cow Negative    6. Casein Negative    7. Egg White, Chicken Negative    8. Shellfish Mix Negative    9. Fish Mix Negative    10. Cashew Negative    11. Walnut Food Negative    12. Almond Negative    13. Hazelnut Negative    14. Pecan Food Negative    15. Pistachio Negative    16. [place] Nut Negative    17. Coconut Negative    18. Trout Negative    19. Tuna Negative    20. Salmon Negative    21. Flounder Negative    22. Codfish Negative    23. Shrimp Negative    24. Crab Negative    25. Lobster Negative    26. Oyster Negative    27. Scallops Negative    28. Oat  Negative    29. Rice Negative    30. Barley Negative    31. Rye  Negative    32. Hops Negative    33. [place] Meat Negative    34. Chicken Meat Negative    38. Tomato Negative    39. White Potato Negative    40. Sweet Potato Negative    41. Pea, Green/English Negative    42. Navy Bean Negative    43. Green Beans Negative    44. Squash Negative    45. Green Pepper Negative    46. Mushrooms Negative    47. Onion Negative    48. Avocado Negative    49. Cabbage Negative    50. Carrots Negative    51. Celery Negative    52. Corn Negative    53. Cucumber Negative    54. Grape (White seedless) Negative    55. Orange  Negative    56. Lemon Negative    57. Banana Negative    58. Apple Negative    59. Peach Negative    60. Strawberry Negative    61. Blueberry Negative    62. Cherry Negative    63. Cantaloupe Negative    64. Watermelon Negative    65. Pineapple Negative    66. Chocolate/Cacao Bean Negative    67. Cinnamon Negative    68. Nutmeg Negative    69. Ginger Negative    70. Garlic Negative    71. Pepper, Black Negative    72. Mustard --   2 x 4

## 2023-03-11 NOTE — Progress Notes (Signed)
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  Unfortunately, she seems to be 1 of those few that suffer from any tiny exposure.  I think that the addition of Xolair would be very helpful for her.  I did give her some information on checking her medications to avoid any accidental exposures to mammalian meat, including forms of magnesium stearate.  She is going to look through this and make a decision about when her next steps to go to be.  Thankfully, testing to the rest of the food panel was negative which should give her some comfort in knowing that she should be able to tolerate quite a wide variety of foods.  Plan/Recommendations:   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 - I would check out [email or web address] to check on all of your medications. - This will even get down to whether the magnesium stearate is plant derived or animal derived.  - I emailed you some articles on alpha gal syndrome (to [email or web address]).  - Strongly consider getting onto Xolair for long term control. - We are going to get levels to look at your milk. - We are going to get a total IgE level in case we decide to start Xolair.  - You can use Ripple milk (green pea based milk) since this has more protein.  - Testing to the entire food panel was negative aside from mustard (this was only slightly positive). - Copy of testing results provided. - I would continue to avoid red meats and cow's milk for now.   2. Chronic rhinitis - We did  not do testing since your symptoms are so well controlled. - We can look into this in the future.   3. Return in about 3 months (around 06/10/2023). You can have the follow up appointment with Dr. Dellis Anes or a Nurse Practicioner (our Nurse Practitioners are excellent and always have Physician oversight!).    This note in its entirety was forwarded to the Provider who requested this consultation.  Subjective:   Angel Edwards is a 67 y.o. female presenting today for evaluation of  Chief Complaint  Patient presents with   Other    Has been to the E.R twice - mid June got bitten by 2 ticks, after had 3 episodes of severe swelling in hands, feet, and eyes, second episode tongue swelling epi was given.    Allergic Reaction    Alpha-Gal - avoiding all red meat, dairy, gelatin, and other red meat products. No magnesium serrate    Allergic Rhinitis     Some seasonal allergies     Angel Edwards has a history of the following: Patient Active Problem List   Diagnosis Date Noted   OA (osteoarthritis) of hip 01/14/2018    History obtained from: chart review and patient.  Angel Edwards was referred by Salvatore Decent, PA-C.     Angel Edwards is a 67 y.o. female presenting for an evaluation of food and environmental allergies .  She has had quite the adventure getting here  today. She was bit by two ticks in June 2024. Around the end of June, she had finger and lip and then foot swelling. This has never happened in the past. Typically she is physically active. She is active and has gardening and chickens and dance class three nights per week.  The week of July 4th, she had swelling that started itching really badly. They would not stop no matter what she put on them. She reports that she had swelling where she could not bend any fingers at all. She had some swelling on her feet as well.   She went to the ED and got Benadryl. She brought up the idea of alpha gal. She had the testing done at [facility]  which is like an Urgent Care. She had this diagnosis confirmed in July or August. This was later in July. Then she called her and was told that her numbers were "off the chart". She was told to avoid anything with mammalian derived foods. She had changing of her voice to a cartoon. She had been to the ED on a few occasions. She was given an EpiPen and she received an injection of EpiPen given in the ED. She recovered well.  Then in mid August, her tongue swelled up again. She remained there and was given two EpiPens and was told to take Pepcid and Benadryl. One of these reactions occurred when she was exposed to a biscuit that she thinks contained buttermilk.   She has now been looking into reading labels and seeing how things were processed.    Allergic Rhinitis Symptom History: She has environmental allergies and treats this with Benadryl or Claritin.  She has some intermittent hay fever in the spring season.  Otherwise she is fine. She denies any recurrent sinus infections. She really is quite stable.   Food Allergy Symptom History: She does have confirmed alpha gal and has been avoiding the gelatin and dairy and red meats. She has been avoiding this since the third reaction. She only has one EpiPen. She would like a refill today.   Otherwise, there is no history of other atopic diseases, including drug allergies, stinging insect allergies, or contact dermatitis. There is no significant infectious history. Vaccinations are up to date.    Past Medical History: Patient Active Problem List   Diagnosis Date Noted   OA (osteoarthritis) of hip 01/14/2018    Medication List:  Allergies as of 03/11/2023       Reactions   Demerol [meperidine Hcl] Nausea And Vomiting   Lidocaine Other (See Comments)   Body absorbs medication too fast, that it wears off quickly        Medication List        Accurate as of March 11, 2023  3:28 PM. If you have any questions, ask your nurse or doctor.           STOP taking these medications    HYDROcodone-acetaminophen 5-325 MG tablet Commonly known as: NORCO/VICODIN Stopped by: Alfonse Spruce   predniSONE 10 MG (21) Tbpk tablet Commonly known as: STERAPRED UNI-PAK 21 TAB Stopped by: Alfonse Spruce   traMADol 50 MG tablet Commonly known as: ULTRAM Stopped by: Alfonse Spruce       TAKE these medications    EPINEPHrine 0.3 mg/0.3 mL Soaj injection Commonly known as: EPI-PEN Inject 0.3 mg into the muscle as needed.   hydrOXYzine 25 MG tablet Commonly known as: ATARAX Take 25 mg by mouth daily as needed  for itching.   levocetirizine 5 MG tablet Commonly known as: XYZAL Take 1 tablet by mouth daily.   methocarbamol 500 MG tablet Commonly known as: ROBAXIN Take 1 tablet (500 mg total) by mouth every 6 (six) hours as needed for muscle spasms.        Birth History: non-contributory  Developmental History: non-contributory  Past Surgical History: Past Surgical History:  Procedure Laterality Date   CESAREAN SECTION  1993;  1997   IR ERCP  2003  approx.   TOTAL HIP ARTHROPLASTY Left 01-30-2010  dr Charlann Boxer  [facility]   TOTAL HIP ARTHROPLASTY Right 01/14/2018   Procedure: RIGHT TOTAL HIP ARTHROPLASTY ANTERIOR APPROACH;  Surgeon: Ollen Gross, MD;  Location: [facility];  Service: Orthopedics;  Laterality: Right;     Family History: History reviewed. No pertinent family history.   Social History: Syretta lives at home with her husband.  They live in a house that is 75 years old.  There is wood throughout the home.  She has electric heating and central cooling.  There are dog, fish, a tortoise, as well as birds in the home.  There are no dust mite covers on the bedding.  There is no tobacco exposure.  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  She is on this for 24 years.  There are no fumes, chemicals, or dust exposure.  She does not live near an interstate or industrial area.  They have 2 children.  1 lives in [place] and 1 lives in [place].  They do not have any grandchildren.  Review of systems otherwise negative other than that mentioned in the HPI.      Objective:   Blood pressure 128/84, pulse 79, temperature 98.7 F (37.1 C), resp. rate 18, height 5' 4.17" (1.63 m), weight 196 lb 14.4 oz (89.3 kg), SpO2 95%. Body mass index is 33.62 kg/m.     Physical Exam Vitals reviewed.  Constitutional:      Appearance: She is well-developed.     Comments: Very talkative.  HENT:     Head: Normocephalic and atraumatic.     Right Ear: Tympanic membrane, ear canal and external ear normal. No drainage, swelling or tenderness. Tympanic membrane is not injected, scarred, erythematous, retracted or bulging.     Left Ear: Tympanic membrane, ear canal and external ear normal. No drainage, swelling or tenderness. Tympanic membrane is not injected, scarred, erythematous, retracted or bulging.     Nose: No nasal deformity, septal deviation, mucosal edema or rhinorrhea.     Right Turbinates: Enlarged, swollen and pale.     Left Turbinates: Enlarged, swollen and pale.     Right Sinus: No maxillary sinus tenderness or frontal sinus tenderness.     Left Sinus: No maxillary sinus tenderness or frontal sinus tenderness.     Mouth/Throat:     Mouth: Mucous membranes are not pale and not dry.     Pharynx: Uvula midline.  Eyes:     General:        Right eye: No discharge.        Left eye: No discharge.     Conjunctiva/sclera: Conjunctivae normal.     Right eye: Right conjunctiva is not injected. No chemosis.    Left eye: Left conjunctiva is not injected. No chemosis.    Pupils: Pupils are equal, round, and reactive to light.  Cardiovascular:     Rate and Rhythm: Normal rate and regular rhythm.  Heart sounds: Normal heart sounds.  Pulmonary:     Effort: Pulmonary effort is normal. No tachypnea, accessory muscle usage or respiratory distress.     Breath  sounds: Normal breath sounds. No wheezing, rhonchi or rales.  Chest:     Chest wall: No tenderness.  Abdominal:     Tenderness: There is no abdominal tenderness. There is no guarding or rebound.  Lymphadenopathy:     Head:     Right side of head: No submandibular, tonsillar or occipital adenopathy.     Left side of head: No submandibular, tonsillar or occipital adenopathy.     Cervical: No cervical adenopathy.  Skin:    Coloration: Skin is not pale.     Findings: No abrasion, erythema, petechiae or rash. Rash is not papular, urticarial or vesicular.  Neurological:     Mental Status: She is alert.  Psychiatric:        Behavior: Behavior is cooperative.      Diagnostic studies:   Allergy Studies:     Food Adult Perc - 03/11/23 1400     Time Antigen Placed 1430    Allergen Manufacturer Waynette Buttery    Location Back    Number of allergen test 69     Control-buffer 50% Glycerol Negative    Control-Histamine 3+    1. Peanut Negative    2. Soybean Negative    3. Wheat Negative    4. Sesame Negative    5. Milk, Cow Negative    6. Casein Negative    7. Egg White, Chicken Negative    8. Shellfish Mix Negative    9. Fish Mix Negative    10. Cashew Negative    11. Walnut Food Negative    12. Almond Negative    13. Hazelnut Negative    14. Pecan Food Negative    15. Pistachio Negative    16. [place] Nut Negative    17. Coconut Negative    18. Trout Negative    19. Tuna Negative    20. Salmon Negative    21. Flounder Negative    22. Codfish Negative    23. Shrimp Negative    24. Crab Negative    25. Lobster Negative    26. Oyster Negative    27. Scallops Negative    28. Oat  Negative    29. Rice Negative    30. Barley Negative    31. Rye  Negative    32. Hops Negative    33. [place] Meat Negative    34. Chicken Meat Negative    38. Tomato Negative    39. White Potato Negative    40. Sweet Potato Negative    41. Pea, Green/English Negative    42. Navy Bean Negative     43. Green Beans Negative    44. Squash Negative    45. Green Pepper Negative    46. Mushrooms Negative    47. Onion Negative    48. Avocado Negative    49. Cabbage Negative    50. Carrots Negative    51. Celery Negative    52. Corn Negative    53. Cucumber Negative    54. Grape (White seedless) Negative    55. Orange  Negative    56. Lemon Negative    57. Banana Negative    58. Apple Negative    59. Peach Negative    60. Strawberry Negative    61. Blueberry Negative    62. Cherry Negative  63. Cantaloupe Negative    64. Watermelon Negative    65. Pineapple Negative    66. Chocolate/Cacao Bean Negative    67. Cinnamon Negative    68. Nutmeg Negative    69. Ginger Negative    70. Garlic Negative    71. Pepper, Black Negative    72. Mustard --   2 x 4            Allergy testing results were read and interpreted by myself, documented by clinical staff.         Malachi Bonds, MD [facility]

## 2023-03-11 NOTE — Addendum Note (Signed)
Addended by: Philipp Deputy on: 03/11/2023 07:42 PM   Modules accepted: Orders

## 2023-03-13 NOTE — Addendum Note (Signed)
Addended by: Orson Aloe on: 03/13/2023 10:52 AM   Modules accepted: Orders

## 2023-03-15 LAB — IGE: IgE (Immunoglobulin E), Serum: 89 [IU]/mL (ref 6–495)

## 2023-03-15 LAB — MILK COMPONENT PANEL
F076-IgE Alpha Lactalbumin: <0.1 kU/L
F077-IgE Beta Lactoglobulin: 0.11 kU/L — AB
F078-IgE Casein: 0.51 kU/L — AB

## 2023-03-15 LAB — TRYPTASE: Tryptase: 4.6 ug/L (ref 2.2–13.2)

## 2023-03-20 MED ORDER — EPINEPHRINE 0.3 MG/0.3ML IJ SOAJ: 0.3000 mg | INTRAMUSCULAR | 2 refills | Status: DC | PRN

## 2023-03-20 NOTE — Addendum Note (Signed)
Addended by: Elsworth Soho on: 03/20/2023 04:20 PM   Modules accepted: Orders

## 2023-06-12 ENCOUNTER — Ambulatory Visit: Payer: No Typology Code available for payment source | Admitting: Allergy & Immunology

## 2023-09-09 ENCOUNTER — Ambulatory Visit (INDEPENDENT_AMBULATORY_CARE_PROVIDER_SITE_OTHER): Payer: No Typology Code available for payment source | Admitting: Allergy & Immunology

## 2023-09-09 ENCOUNTER — Other Ambulatory Visit: Payer: Self-pay

## 2023-09-09 ENCOUNTER — Encounter: Payer: Self-pay | Admitting: Allergy & Immunology

## 2023-09-09 VITALS — BP 114/70 | HR 83 | Temp 98.7°F | Resp 16

## 2023-09-09 DIAGNOSIS — R221 Localized swelling, mass and lump, neck: Secondary | ICD-10-CM | POA: Diagnosis not present

## 2023-09-09 DIAGNOSIS — R220 Localized swelling, mass and lump, head: Secondary | ICD-10-CM

## 2023-09-09 DIAGNOSIS — T7800XD Anaphylactic reaction due to unspecified food, subsequent encounter: Secondary | ICD-10-CM | POA: Diagnosis not present

## 2023-09-09 MED ORDER — EPINEPHRINE 0.3 MG/0.3ML IJ SOAJ: 0.3000 mg | Freq: Once | INTRAMUSCULAR | 2 refills | Status: AC

## 2023-09-09 NOTE — Progress Notes (Unsigned)
 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 Anaphylactic shock due to food - Continue with the Ripple milk since you are doing well with that. - We will get a repeat alpha gal panel to see where we are going.  - You seem to have a good handle on the symptoms.  - We could consider the addition of Xolair for an extra layer of protection if needed.  - EpiPen renewed today.   2. Chronic rhinitis - We can always do testing for environmental allergens in the future if needed.   3. Return in about 1 year (around 09/08/2024). You can have the follow up appointment with Dr. Dellis Anes or a Nurse Practicioner (our Nurse Practitioners are excellent and always have Physician oversight!).   Subjective:   Angel Edwards is a 68 y.o. female presenting today for follow up of  Chief Complaint  Patient presents with   alpha-gal    Angel Edwards has a history of the following: Patient Active Problem List   Diagnosis Date Noted   OA (osteoarthritis) of hip 01/14/2018    History obtained from: chart review and patient.  Discussed the use of AI scribe software for clinical note transcription with the patient and/or guardian, who gave verbal consent to proceed.  Angel Edwards is a 68 y.o. female presenting for a follow up visit. She was last seen in September 2024. At that time, we recommended continued avoidance of alpha gal and mammalian meat. She also decided to avoid milk to see if this would help. For her rhinitis, we continued with antihistamines as needed.   Since the last visit, she has done remarkably well.   She experienced a recent allergic reaction characterized by tongue swelling after consuming a product from the freezer  section of a grocery store, which was labeled as safe. She immediately used an EpiPen, took Benadryl, and 40 mg of famotidine, and then went to the emergency room parking lot, where she waited without entering as her symptoms began to subside.  She has since stopped purchasing frozen foods from the grocery store due to the risk of allergic reactions. She is currently using Ripple milk and has adjusted her diet to include chicken, shrimp, fish, and [place], avoiding dairy and other potential allergens. She has one EpiPen left and plans to obtain more. She has also found a supportive community Engineer, materials, which provides resources for managing her condition.  Her social history includes a strong focus on gardening, where she grows much of her own food, including tomatoes, brussel sprouts, broccoli, cabbage, and various greens. 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 She has no travel plans and prefers to stay local.     Otherwise, there have been no changes to her past medical history, surgical history, family history, or social history.    Review of systems otherwise negative other than that mentioned in the HPI.    Objective:   Blood pressure 114/70, pulse 83, temperature 98.7 F (37.1 C), temperature source Temporal, resp. rate 16, SpO2 94%. There is no height or weight on file to calculate BMI.    Physical Exam Vitals  reviewed.  Constitutional:      Appearance: She is well-developed.     Comments: Very talkative. Pleasant.   HENT:     Head: Normocephalic and atraumatic.     Right Ear: Tympanic membrane, ear canal and external ear normal. No drainage, swelling or tenderness. Tympanic membrane is not injected, scarred, erythematous, retracted or bulging.     Left Ear: Tympanic membrane, ear canal and external ear normal. No drainage, swelling or tenderness. Tympanic membrane is not injected,  scarred, erythematous, retracted or bulging.     Nose: No nasal deformity, septal deviation, mucosal edema or rhinorrhea.     Right Turbinates: Enlarged, swollen and pale.     Left Turbinates: Enlarged, swollen and pale.     Right Sinus: No maxillary sinus tenderness or frontal sinus tenderness.     Left Sinus: No maxillary sinus tenderness or frontal sinus tenderness.     Mouth/Throat:     Mouth: Mucous membranes are not pale and not dry.     Pharynx: Uvula midline.  Eyes:     General:        Right eye: No discharge.        Left eye: No discharge.     Conjunctiva/sclera: Conjunctivae normal.     Right eye: Right conjunctiva is not injected. No chemosis.    Left eye: Left conjunctiva is not injected. No chemosis.    Pupils: Pupils are equal, round, and reactive to light.  Cardiovascular:     Rate and Rhythm: Normal rate and regular rhythm.     Heart sounds: Normal heart sounds.  Pulmonary:     Effort: Pulmonary effort is normal. No tachypnea, accessory muscle usage or respiratory distress.     Breath sounds: Normal breath sounds. No wheezing, rhonchi or rales.  Chest:     Chest wall: No tenderness.  Lymphadenopathy:     Head:     Right side of head: No submandibular, tonsillar or occipital adenopathy.     Left side of head: No submandibular, tonsillar or occipital adenopathy.     Cervical: No cervical adenopathy.  Skin:    Coloration: Skin is not pale.     Findings: No abrasion, erythema, petechiae or rash. Rash is not papular, urticarial or vesicular.  Neurological:     Mental Status: She is alert.  Psychiatric:        Behavior: Behavior is cooperative.      Diagnostic studies: labs sent instead      Malachi Bonds, MD  [facility]

## 2023-09-09 NOTE — Patient Instructions (Addendum)
 1. Anaphylactic shock due to food - Continue with the Ripple milk since you are doing well with that. - We will get a repeat alpha gal panel to see where we are going.  - You seem to have a good handle on the symptoms.  - We could consider the addition of Xolair for an extra layer of protection if needed.  - EpiPen renewed today.   2. Chronic rhinitis - We can always do testing for environmental allergens in the future if needed.   3. Return in about 1 year (around 09/08/2024). You can have the follow up appointment with Dr. Dellis Anes or a Nurse Practicioner (our Nurse Practitioners are excellent and always have Physician oversight!).    Please inform us of any Emergency Department visits, hospitalizations, or changes in symptoms. Call us before going to the ED for breathing or allergy symptoms since we might be able to fit you in for a sick visit. Feel free to contact us anytime with any questions, problems, or concerns.  It was a pleasure to see you again today!  Websites that have reliable patient information: 1. American Academy of Asthma, Allergy, and Immunology: www.aaaai.org 2. Food Allergy Research and Education (FARE): foodallergy.org 3. Mothers of Asthmatics: http://www.asthmacommunitynetwork.org 4. American College of Allergy, Asthma, and Immunology: www.acaai.org      "Like" [place] on Facebook and Instagram for our latest updates!      A healthy democracy works best when [employer] participate! Make sure you are registered to vote! If you have moved or changed any of your contact information, you will need to get this updated before voting! Scan the QR codes below to learn more!

## 2023-09-10 ENCOUNTER — Encounter: Payer: Self-pay | Admitting: Allergy & Immunology

## 2023-09-13 LAB — ALPHA-GAL PANEL
Allergen Lamb IgE: 0.35 kU/L — AB
Beef IgE: 1.93 kU/L — AB
IgE (Immunoglobulin E), Serum: 48 [IU]/mL (ref 6–495)
O215-IgE Alpha-Gal: 2.23 kU/L — AB
Pork IgE: 0.92 kU/L — AB

## 2024-02-09 DIAGNOSIS — H43813 Vitreous degeneration, bilateral: Secondary | ICD-10-CM | POA: Diagnosis not present

## 2024-02-09 DIAGNOSIS — H25813 Combined forms of age-related cataract, bilateral: Secondary | ICD-10-CM | POA: Diagnosis not present

## 2024-05-12 DIAGNOSIS — K080 Exfoliation of teeth due to systemic causes: Secondary | ICD-10-CM | POA: Diagnosis not present

## 2024-05-17 DIAGNOSIS — Z0000 Encounter for general adult medical examination without abnormal findings: Secondary | ICD-10-CM | POA: Diagnosis not present

## 2024-05-17 DIAGNOSIS — E7800 Pure hypercholesterolemia, unspecified: Secondary | ICD-10-CM | POA: Diagnosis not present

## 2024-05-17 DIAGNOSIS — R82998 Other abnormal findings in urine: Secondary | ICD-10-CM | POA: Diagnosis not present

## 2024-09-07 ENCOUNTER — Ambulatory Visit: Admitting: Allergy & Immunology
